# Patient Record
Sex: Male | Born: 1960 | Race: White | Hispanic: No | Marital: Married | Smoking: Never smoker
Health system: Southern US, Community
[De-identification: ages and names within clinical notes are randomized; demographics above are authoritative.]

## PROBLEM LIST (undated history)

## (undated) DIAGNOSIS — K635 Polyp of colon: Secondary | ICD-10-CM

## (undated) DIAGNOSIS — C9140 Hairy cell leukemia not having achieved remission: Secondary | ICD-10-CM

## (undated) HISTORY — DX: Polyp of colon: K63.5

## (undated) HISTORY — PX: TONSILLECTOMY AND ADENOIDECTOMY: SUR1326

## (undated) HISTORY — PX: BILATERAL KNEE ARTHROSCOPY: SUR91

---

## 2009-02-13 ENCOUNTER — Ambulatory Visit: Payer: Self-pay | Admitting: Oncology

## 2009-02-13 ENCOUNTER — Other Ambulatory Visit: Admission: RE | Admit: 2009-02-13 | Discharge: 2009-02-13 | Payer: Self-pay | Admitting: Oncology

## 2009-02-13 LAB — CBC WITH DIFFERENTIAL/PLATELET
BASO%: 0.2 % (ref 0.0–2.0)
Basophils Absolute: 0 10*3/uL (ref 0.0–0.1)
EOS%: 0.6 % (ref 0.0–7.0)
Eosinophils Absolute: 0 10*3/uL (ref 0.0–0.5)
HCT: 49.2 % (ref 38.4–49.9)
HGB: 16.9 g/dL (ref 13.0–17.1)
LYMPH%: 26.4 % (ref 14.0–49.0)
MCH: 30.6 pg (ref 27.2–33.4)
MCHC: 34.3 g/dL (ref 32.0–36.0)
MCV: 89 fL (ref 79.3–98.0)
MONO#: 0.1 10*3/uL (ref 0.1–0.9)
MONO%: 4.2 % (ref 0.0–14.0)
NEUT#: 1.8 10*3/uL (ref 1.5–6.5)
NEUT%: 68.6 % (ref 39.0–75.0)
Platelets: 58 10*3/uL — ABNORMAL LOW (ref 140–400)
RBC: 5.53 10*6/uL (ref 4.20–5.82)
RDW: 14.1 % (ref 11.0–14.6)
WBC: 2.6 10*3/uL — ABNORMAL LOW (ref 4.0–10.3)
lymph#: 0.7 10*3/uL — ABNORMAL LOW (ref 0.9–3.3)

## 2009-02-13 LAB — MORPHOLOGY
PLT EST: DECREASED
RBC Comments: NORMAL

## 2009-02-13 LAB — CHCC SMEAR

## 2009-02-17 LAB — COMPREHENSIVE METABOLIC PANEL
ALT: 21 U/L (ref 0–53)
AST: 16 U/L (ref 0–37)
Albumin: 4.6 g/dL (ref 3.5–5.2)
Alkaline Phosphatase: 60 U/L (ref 39–117)
BUN: 15 mg/dL (ref 6–23)
CO2: 24 mEq/L (ref 19–32)
Calcium: 8.6 mg/dL (ref 8.4–10.5)
Chloride: 106 mEq/L (ref 96–112)
Creatinine, Ser: 1.34 mg/dL (ref 0.40–1.50)
Glucose, Bld: 152 mg/dL — ABNORMAL HIGH (ref 70–99)
Potassium: 4.1 mEq/L (ref 3.5–5.3)
Sodium: 142 mEq/L (ref 135–145)
Total Bilirubin: 1.2 mg/dL (ref 0.3–1.2)
Total Protein: 6.6 g/dL (ref 6.0–8.3)

## 2009-02-17 LAB — IMMUNOFIXATION ELECTROPHORESIS
IgG (Immunoglobin G), Serum: 541 mg/dL — ABNORMAL LOW (ref 694–1618)
Total Protein, Serum Electrophoresis: 6.6 g/dL (ref 6.0–8.3)

## 2009-02-17 LAB — LACTATE DEHYDROGENASE: LDH: 153 U/L (ref 94–250)

## 2009-02-17 LAB — URIC ACID: Uric Acid, Serum: 6.7 mg/dL (ref 4.0–7.8)

## 2009-02-20 ENCOUNTER — Ambulatory Visit: Payer: Self-pay | Admitting: Oncology

## 2009-02-20 ENCOUNTER — Ambulatory Visit (HOSPITAL_COMMUNITY): Admission: RE | Admit: 2009-02-20 | Discharge: 2009-02-20 | Payer: Self-pay | Admitting: Oncology

## 2009-02-20 LAB — FLOW CYTOMETRY

## 2009-02-21 ENCOUNTER — Ambulatory Visit (HOSPITAL_COMMUNITY): Admission: RE | Admit: 2009-02-21 | Discharge: 2009-02-21 | Payer: Self-pay | Admitting: Oncology

## 2009-02-26 LAB — CBC WITH DIFFERENTIAL/PLATELET
Basophils Absolute: 0 10*3/uL (ref 0.0–0.1)
Eosinophils Absolute: 0 10*3/uL (ref 0.0–0.5)
HCT: 48 % (ref 38.4–49.9)
HGB: 16.6 g/dL (ref 13.0–17.1)
MONO#: 0.1 10*3/uL (ref 0.1–0.9)
NEUT%: 57.1 % (ref 39.0–75.0)
WBC: 2.5 10*3/uL — ABNORMAL LOW (ref 4.0–10.3)
lymph#: 0.9 10*3/uL (ref 0.9–3.3)

## 2009-02-26 LAB — CHCC SMEAR

## 2009-02-26 LAB — MORPHOLOGY: PLT EST: DECREASED

## 2009-03-06 LAB — COMPREHENSIVE METABOLIC PANEL WITH GFR
ALT: 28 U/L (ref 0–53)
AST: 19 U/L (ref 0–37)
Albumin: 4.8 g/dL (ref 3.5–5.2)
Alkaline Phosphatase: 58 U/L (ref 39–117)
BUN: 15 mg/dL (ref 6–23)
CO2: 25 meq/L (ref 19–32)
Calcium: 9.8 mg/dL (ref 8.4–10.5)
Chloride: 104 meq/L (ref 96–112)
Creatinine, Ser: 1.22 mg/dL (ref 0.40–1.50)
Glucose, Bld: 97 mg/dL (ref 70–99)
Potassium: 4.1 meq/L (ref 3.5–5.3)
Sodium: 139 meq/L (ref 135–145)
Total Bilirubin: 1.4 mg/dL — ABNORMAL HIGH (ref 0.3–1.2)
Total Protein: 6.9 g/dL (ref 6.0–8.3)

## 2009-03-06 LAB — CBC WITH DIFFERENTIAL/PLATELET
BASO%: 0.2 % (ref 0.0–2.0)
Basophils Absolute: 0 10e3/uL (ref 0.0–0.1)
EOS%: 1 % (ref 0.0–7.0)
Eosinophils Absolute: 0 10e3/uL (ref 0.0–0.5)
HCT: 49.3 % (ref 38.4–49.9)
HGB: 16.9 g/dL (ref 13.0–17.1)
LYMPH%: 37.5 % (ref 14.0–49.0)
MCH: 30.7 pg (ref 27.2–33.4)
MCHC: 34.3 g/dL (ref 32.0–36.0)
MCV: 89.5 fL (ref 79.3–98.0)
MONO#: 0.1 10e3/uL (ref 0.1–0.9)
MONO%: 2.9 % (ref 0.0–14.0)
NEUT#: 1.6 10e3/uL (ref 1.5–6.5)
NEUT%: 58.4 % (ref 39.0–75.0)
Platelets: 57 10e3/uL — ABNORMAL LOW (ref 140–400)
RBC: 5.51 10e6/uL (ref 4.20–5.82)
RDW: 13.9 % (ref 11.0–14.6)
WBC: 2.7 10e3/uL — ABNORMAL LOW (ref 4.0–10.3)
lymph#: 1 10e3/uL (ref 0.9–3.3)

## 2009-03-06 LAB — URIC ACID: Uric Acid, Serum: 6.4 mg/dL (ref 4.0–7.8)

## 2009-03-20 ENCOUNTER — Ambulatory Visit: Payer: Self-pay | Admitting: Oncology

## 2009-03-24 LAB — CBC WITH DIFFERENTIAL/PLATELET
Eosinophils Absolute: 0 10*3/uL (ref 0.0–0.5)
LYMPH%: 39.6 % (ref 14.0–49.0)
MCHC: 35 g/dL (ref 32.0–36.0)
MCV: 86.5 fL (ref 79.3–98.0)
MONO%: 4.9 % (ref 0.0–14.0)
NEUT#: 1.4 10*3/uL — ABNORMAL LOW (ref 1.5–6.5)
NEUT%: 54.3 % (ref 39.0–75.0)
Platelets: 43 10*3/uL — ABNORMAL LOW (ref 140–400)
RBC: 5.41 10*6/uL (ref 4.20–5.82)
nRBC: 0 % (ref 0–0)

## 2009-03-31 LAB — CBC WITH DIFFERENTIAL/PLATELET
BASO%: 0.4 % (ref 0.0–2.0)
EOS%: 0.7 % (ref 0.0–7.0)
HCT: 43.9 % (ref 38.4–49.9)
LYMPH%: 15.4 % (ref 14.0–49.0)
MCH: 30 pg (ref 27.2–33.4)
MCHC: 34.9 g/dL (ref 32.0–36.0)
MCV: 86.1 fL (ref 79.3–98.0)
MONO%: 1.1 % (ref 0.0–14.0)
NEUT%: 82.4 % — ABNORMAL HIGH (ref 39.0–75.0)
lymph#: 0.4 10*3/uL — ABNORMAL LOW (ref 0.9–3.3)

## 2009-04-07 LAB — CBC WITH DIFFERENTIAL/PLATELET
Basophils Absolute: 0 10*3/uL (ref 0.0–0.1)
Eosinophils Absolute: 0 10*3/uL (ref 0.0–0.5)
HGB: 15.8 g/dL (ref 13.0–17.1)
LYMPH%: 13.9 % — ABNORMAL LOW (ref 14.0–49.0)
MONO#: 0 10*3/uL — ABNORMAL LOW (ref 0.1–0.9)
NEUT%: 84.2 % — ABNORMAL HIGH (ref 39.0–75.0)

## 2009-04-14 LAB — COMPREHENSIVE METABOLIC PANEL
ALT: 37 U/L (ref 0–53)
Alkaline Phosphatase: 67 U/L (ref 39–117)
Sodium: 142 mEq/L (ref 135–145)
Total Bilirubin: 0.8 mg/dL (ref 0.3–1.2)
Total Protein: 7 g/dL (ref 6.0–8.3)

## 2009-04-14 LAB — CBC WITH DIFFERENTIAL/PLATELET
EOS%: 0.8 % (ref 0.0–7.0)
MCH: 31.5 pg (ref 27.2–33.4)
MCV: 90.3 fL (ref 79.3–98.0)
MONO%: 0.5 % (ref 0.0–14.0)
NEUT#: 4.2 10*3/uL (ref 1.5–6.5)
RBC: 4.86 10*6/uL (ref 4.20–5.82)
RDW: 14.2 % (ref 11.0–14.6)

## 2009-04-14 LAB — LACTATE DEHYDROGENASE: LDH: 137 U/L (ref 94–250)

## 2009-05-08 ENCOUNTER — Ambulatory Visit: Payer: Self-pay | Admitting: Oncology

## 2009-05-12 LAB — CBC WITH DIFFERENTIAL/PLATELET
BASO%: 0.3 % (ref 0.0–2.0)
HCT: 44.3 % (ref 38.4–49.9)
LYMPH%: 17.9 % (ref 14.0–49.0)
MCH: 30.7 pg (ref 27.2–33.4)
MCHC: 34.8 g/dL (ref 32.0–36.0)
MCV: 88.4 fL (ref 79.3–98.0)
MONO#: 0 10*3/uL — ABNORMAL LOW (ref 0.1–0.9)
MONO%: 1 % (ref 0.0–14.0)
NEUT%: 79.8 % — ABNORMAL HIGH (ref 39.0–75.0)
Platelets: 102 10*3/uL — ABNORMAL LOW (ref 140–400)
WBC: 3 10*3/uL — ABNORMAL LOW (ref 4.0–10.3)

## 2009-05-26 ENCOUNTER — Other Ambulatory Visit: Admission: RE | Admit: 2009-05-26 | Discharge: 2009-05-26 | Payer: Self-pay | Admitting: Oncology

## 2009-05-26 LAB — CBC WITH DIFFERENTIAL/PLATELET
BASO%: 0.3 % (ref 0.0–2.0)
EOS%: 1.1 % (ref 0.0–7.0)
HCT: 43.7 % (ref 38.4–49.9)
LYMPH%: 17.4 % (ref 14.0–49.0)
MCH: 31.2 pg (ref 27.2–33.4)
MCHC: 34.7 g/dL (ref 32.0–36.0)
NEUT%: 79.1 % — ABNORMAL HIGH (ref 39.0–75.0)
lymph#: 0.6 10*3/uL — ABNORMAL LOW (ref 0.9–3.3)

## 2009-06-09 ENCOUNTER — Ambulatory Visit: Payer: Self-pay | Admitting: Oncology

## 2009-06-09 LAB — CBC WITH DIFFERENTIAL/PLATELET
Basophils Absolute: 0 10*3/uL (ref 0.0–0.1)
Eosinophils Absolute: 0 10*3/uL (ref 0.0–0.5)
HCT: 45 % (ref 38.4–49.9)
HGB: 15.9 g/dL (ref 13.0–17.1)
MCV: 86.5 fL (ref 79.3–98.0)
NEUT#: 3.2 10*3/uL (ref 1.5–6.5)
NEUT%: 77 % — ABNORMAL HIGH (ref 39.0–75.0)
RDW: 12.6 % (ref 11.0–14.6)
lymph#: 0.8 10*3/uL — ABNORMAL LOW (ref 0.9–3.3)

## 2009-07-08 LAB — CBC WITH DIFFERENTIAL/PLATELET
Basophils Absolute: 0 10*3/uL (ref 0.0–0.1)
Eosinophils Absolute: 0 10*3/uL (ref 0.0–0.5)
HCT: 46.5 % (ref 38.4–49.9)
HGB: 16.2 g/dL (ref 13.0–17.1)
LYMPH%: 16.5 % (ref 14.0–49.0)
MCV: 89.1 fL (ref 79.3–98.0)
MONO#: 0.1 10*3/uL (ref 0.1–0.9)
NEUT#: 3.2 10*3/uL (ref 1.5–6.5)
NEUT%: 78.9 % — ABNORMAL HIGH (ref 39.0–75.0)
Platelets: 112 10*3/uL — ABNORMAL LOW (ref 140–400)
WBC: 4 10*3/uL (ref 4.0–10.3)

## 2009-07-31 ENCOUNTER — Ambulatory Visit: Payer: Self-pay | Admitting: Oncology

## 2009-08-04 LAB — CBC WITH DIFFERENTIAL/PLATELET
Basophils Absolute: 0 10*3/uL (ref 0.0–0.1)
Eosinophils Absolute: 0 10*3/uL (ref 0.0–0.5)
HCT: 46.9 % (ref 38.4–49.9)
HGB: 16.6 g/dL (ref 13.0–17.1)
MONO#: 0.2 10*3/uL (ref 0.1–0.9)
NEUT#: 3.6 10*3/uL (ref 1.5–6.5)
NEUT%: 80.1 % — ABNORMAL HIGH (ref 39.0–75.0)
RDW: 12.2 % (ref 11.0–14.6)
lymph#: 0.7 10*3/uL — ABNORMAL LOW (ref 0.9–3.3)

## 2009-08-15 ENCOUNTER — Other Ambulatory Visit: Admission: RE | Admit: 2009-08-15 | Discharge: 2009-08-15 | Payer: Self-pay | Admitting: Oncology

## 2009-08-15 LAB — CBC WITH DIFFERENTIAL/PLATELET
Basophils Absolute: 0 10*3/uL (ref 0.0–0.1)
EOS%: 1.1 % (ref 0.0–7.0)
Eosinophils Absolute: 0 10*3/uL (ref 0.0–0.5)
LYMPH%: 14.3 % (ref 14.0–49.0)
MCH: 30.3 pg (ref 27.2–33.4)
MCV: 87.1 fL (ref 79.3–98.0)
MONO%: 4.5 % (ref 0.0–14.0)
Platelets: 119 10*3/uL — ABNORMAL LOW (ref 140–400)
RBC: 5.35 10*6/uL (ref 4.20–5.82)
RDW: 12.4 % (ref 11.0–14.6)

## 2009-08-15 LAB — COMPREHENSIVE METABOLIC PANEL
AST: 26 U/L (ref 0–37)
Albumin: 3.9 g/dL (ref 3.5–5.2)
BUN: 15 mg/dL (ref 6–23)
Calcium: 9 mg/dL (ref 8.4–10.5)
Chloride: 105 mEq/L (ref 96–112)
Creatinine, Ser: 1.63 mg/dL — ABNORMAL HIGH (ref 0.40–1.50)
Glucose, Bld: 106 mg/dL — ABNORMAL HIGH (ref 70–99)
Potassium: 3.9 mEq/L (ref 3.5–5.3)

## 2009-08-15 LAB — URIC ACID: Uric Acid, Serum: 7.6 mg/dL (ref 4.0–7.8)

## 2009-08-19 LAB — FLOW CYTOMETRY

## 2009-10-08 ENCOUNTER — Ambulatory Visit: Payer: Self-pay | Admitting: Oncology

## 2009-10-10 LAB — BASIC METABOLIC PANEL
BUN: 19 mg/dL (ref 6–23)
CO2: 28 mEq/L (ref 19–32)
Chloride: 107 mEq/L (ref 96–112)
Creatinine, Ser: 1.39 mg/dL (ref 0.40–1.50)
Potassium: 3.9 mEq/L (ref 3.5–5.3)

## 2009-10-10 LAB — CBC WITH DIFFERENTIAL/PLATELET
Basophils Absolute: 0 10*3/uL (ref 0.0–0.1)
Eosinophils Absolute: 0 10*3/uL (ref 0.0–0.5)
HCT: 47.3 % (ref 38.4–49.9)
HGB: 16.1 g/dL (ref 13.0–17.1)
MCH: 29.5 pg (ref 27.2–33.4)
MONO#: 0.1 10*3/uL (ref 0.1–0.9)
NEUT#: 3.4 10*3/uL (ref 1.5–6.5)
NEUT%: 79.1 % — ABNORMAL HIGH (ref 39.0–75.0)
RDW: 12.7 % (ref 11.0–14.6)
WBC: 4.3 10*3/uL (ref 4.0–10.3)
lymph#: 0.7 10*3/uL — ABNORMAL LOW (ref 0.9–3.3)

## 2009-12-03 ENCOUNTER — Ambulatory Visit: Payer: Self-pay | Admitting: Oncology

## 2009-12-08 LAB — BASIC METABOLIC PANEL
CO2: 29 mEq/L (ref 19–32)
Calcium: 9.2 mg/dL (ref 8.4–10.5)
Creatinine, Ser: 1.38 mg/dL (ref 0.40–1.50)
Sodium: 142 mEq/L (ref 135–145)

## 2009-12-08 LAB — CBC WITH DIFFERENTIAL/PLATELET
BASO%: 0.2 % (ref 0.0–2.0)
EOS%: 1 % (ref 0.0–7.0)
LYMPH%: 13.1 % — ABNORMAL LOW (ref 14.0–49.0)
MCH: 30.3 pg (ref 27.2–33.4)
MCHC: 35.1 g/dL (ref 32.0–36.0)
MONO#: 0.2 10*3/uL (ref 0.1–0.9)
Platelets: 122 10*3/uL — ABNORMAL LOW (ref 140–400)
RBC: 5.43 10*6/uL (ref 4.20–5.82)
WBC: 4.5 10*3/uL (ref 4.0–10.3)

## 2010-01-19 ENCOUNTER — Ambulatory Visit: Payer: Self-pay | Admitting: Oncology

## 2010-01-21 LAB — BASIC METABOLIC PANEL
BUN: 18 mg/dL (ref 6–23)
Calcium: 9.1 mg/dL (ref 8.4–10.5)
Creatinine, Ser: 1.32 mg/dL (ref 0.40–1.50)
Glucose, Bld: 123 mg/dL — ABNORMAL HIGH (ref 70–99)

## 2010-01-21 LAB — CBC WITH DIFFERENTIAL/PLATELET
Basophils Absolute: 0 10*3/uL (ref 0.0–0.1)
EOS%: 0.9 % (ref 0.0–7.0)
HCT: 46.2 % (ref 38.4–49.9)
HGB: 16.3 g/dL (ref 13.0–17.1)
LYMPH%: 13.5 % — ABNORMAL LOW (ref 14.0–49.0)
MCH: 30.5 pg (ref 27.2–33.4)
MCV: 86.4 fL (ref 79.3–98.0)
MONO%: 3.1 % (ref 0.0–14.0)
NEUT%: 82.2 % — ABNORMAL HIGH (ref 39.0–75.0)
Platelets: 121 10*3/uL — ABNORMAL LOW (ref 140–400)
RDW: 12.6 % (ref 11.0–14.6)

## 2010-02-19 ENCOUNTER — Ambulatory Visit (HOSPITAL_BASED_OUTPATIENT_CLINIC_OR_DEPARTMENT_OTHER): Payer: Self-pay | Admitting: Oncology

## 2010-02-20 LAB — CBC WITH DIFFERENTIAL/PLATELET
BASO%: 0.1 % (ref 0.0–2.0)
Basophils Absolute: 0 10*3/uL (ref 0.0–0.1)
EOS%: 0.8 % (ref 0.0–7.0)
Eosinophils Absolute: 0 10*3/uL (ref 0.0–0.5)
HCT: 48.5 % (ref 38.4–49.9)
HGB: 16.9 g/dL (ref 13.0–17.1)
LYMPH%: 12.8 % — ABNORMAL LOW (ref 14.0–49.0)
MCH: 30 pg (ref 27.2–33.4)
MCHC: 34.8 g/dL (ref 32.0–36.0)
MCV: 86.3 fL (ref 79.3–98.0)
MONO#: 0.1 10*3/uL (ref 0.1–0.9)
MONO%: 1.7 % (ref 0.0–14.0)
NEUT#: 4.1 10*3/uL (ref 1.5–6.5)
NEUT%: 84.6 % — ABNORMAL HIGH (ref 39.0–75.0)
Platelets: 119 10*3/uL — ABNORMAL LOW (ref 140–400)
RBC: 5.62 10*6/uL (ref 4.20–5.82)
RDW: 13 % (ref 11.0–14.6)
WBC: 4.8 10*3/uL (ref 4.0–10.3)
lymph#: 0.6 10*3/uL — ABNORMAL LOW (ref 0.9–3.3)

## 2010-02-20 LAB — COMPREHENSIVE METABOLIC PANEL
ALT: 24 U/L (ref 0–53)
AST: 18 U/L (ref 0–37)
Albumin: 4.9 g/dL (ref 3.5–5.2)
Alkaline Phosphatase: 58 U/L (ref 39–117)
BUN: 20 mg/dL (ref 6–23)
CO2: 28 mEq/L (ref 19–32)
Calcium: 9.8 mg/dL (ref 8.4–10.5)
Chloride: 105 mEq/L (ref 96–112)
Creatinine, Ser: 1.27 mg/dL (ref 0.40–1.50)
Glucose, Bld: 104 mg/dL — ABNORMAL HIGH (ref 70–99)
Potassium: 4.5 mEq/L (ref 3.5–5.3)
Sodium: 140 mEq/L (ref 135–145)
Total Bilirubin: 1.1 mg/dL (ref 0.3–1.2)
Total Protein: 7.1 g/dL (ref 6.0–8.3)

## 2010-02-20 LAB — LACTATE DEHYDROGENASE: LDH: 158 U/L (ref 94–250)

## 2010-04-26 LAB — DIFFERENTIAL
Basophils Absolute: 0 10*3/uL (ref 0.0–0.1)
Eosinophils Absolute: 0 10*3/uL (ref 0.0–0.7)
Lymphocytes Relative: 35 % (ref 12–46)
Monocytes Relative: 3 % (ref 3–12)

## 2010-04-26 LAB — BONE MARROW EXAM: Bone Marrow Exam: 30

## 2010-04-26 LAB — CBC
Platelets: 53 10*3/uL — ABNORMAL LOW (ref 150–400)
RDW: 14.3 % (ref 11.5–15.5)
WBC: 1.6 10*3/uL — ABNORMAL LOW (ref 4.0–10.5)

## 2010-04-26 LAB — CHROMOSOME ANALYSIS, BONE MARROW

## 2010-05-22 DIAGNOSIS — C9140 Hairy cell leukemia not having achieved remission: Secondary | ICD-10-CM

## 2010-06-09 ENCOUNTER — Encounter: Payer: BC Managed Care – PPO | Admitting: Oncology

## 2010-06-09 LAB — COMPREHENSIVE METABOLIC PANEL
ALT: 34 U/L (ref 0–53)
Alkaline Phosphatase: 59 U/L (ref 39–117)
CO2: 27 mEq/L (ref 19–32)
Creatinine, Ser: 1.35 mg/dL (ref 0.40–1.50)
Glucose, Bld: 90 mg/dL (ref 70–99)
Sodium: 140 mEq/L (ref 135–145)
Total Bilirubin: 1.1 mg/dL (ref 0.3–1.2)
Total Protein: 6.3 g/dL (ref 6.0–8.3)

## 2010-06-09 LAB — CBC WITH DIFFERENTIAL/PLATELET
BASO%: 0.1 % (ref 0.0–2.0)
HCT: 46.4 % (ref 38.4–49.9)
LYMPH%: 19.2 % (ref 14.0–49.0)
MCH: 30.1 pg (ref 27.2–33.4)
MCHC: 35 g/dL (ref 32.0–36.0)
MCV: 86 fL (ref 79.3–98.0)
MONO#: 0.1 10*3/uL (ref 0.1–0.9)
MONO%: 3.3 % (ref 0.0–14.0)
NEUT%: 76.4 % — ABNORMAL HIGH (ref 39.0–75.0)
Platelets: 122 10*3/uL — ABNORMAL LOW (ref 140–400)
RBC: 5.4 10*6/uL (ref 4.20–5.82)

## 2010-06-09 LAB — LACTATE DEHYDROGENASE: LDH: 181 U/L (ref 94–250)

## 2010-08-27 ENCOUNTER — Other Ambulatory Visit (HOSPITAL_COMMUNITY): Payer: Self-pay | Admitting: Oncology

## 2010-08-27 ENCOUNTER — Encounter (HOSPITAL_BASED_OUTPATIENT_CLINIC_OR_DEPARTMENT_OTHER): Payer: BC Managed Care – PPO | Admitting: Oncology

## 2010-08-27 DIAGNOSIS — C9140 Hairy cell leukemia not having achieved remission: Secondary | ICD-10-CM

## 2010-08-27 LAB — CBC WITH DIFFERENTIAL/PLATELET
Basophils Absolute: 0 10*3/uL (ref 0.0–0.1)
HCT: 46.8 % (ref 38.4–49.9)
HGB: 16.2 g/dL (ref 13.0–17.1)
LYMPH%: 14.6 % (ref 14.0–49.0)
MCHC: 34.5 g/dL (ref 32.0–36.0)
MONO#: 0.1 10*3/uL (ref 0.1–0.9)
NEUT%: 81.4 % — ABNORMAL HIGH (ref 39.0–75.0)
Platelets: 128 10*3/uL — ABNORMAL LOW (ref 140–400)
WBC: 3.9 10*3/uL — ABNORMAL LOW (ref 4.0–10.3)
lymph#: 0.6 10*3/uL — ABNORMAL LOW (ref 0.9–3.3)

## 2010-08-27 LAB — COMPREHENSIVE METABOLIC PANEL
BUN: 20 mg/dL (ref 6–23)
CO2: 26 mEq/L (ref 19–32)
Calcium: 9.3 mg/dL (ref 8.4–10.5)
Chloride: 104 mEq/L (ref 96–112)
Creatinine, Ser: 1.32 mg/dL (ref 0.50–1.35)
Glucose, Bld: 95 mg/dL (ref 70–99)
Total Bilirubin: 1.2 mg/dL (ref 0.3–1.2)

## 2010-08-27 LAB — LACTATE DEHYDROGENASE: LDH: 172 U/L (ref 94–250)

## 2010-11-03 IMAGING — US US ABDOMEN COMPLETE
1 series · 14 of 25 positions shown · non-contrast
Comparison: None.

CLINICAL DATA: Pancytopenia, evaluate spleen

COMPLETE ABDOMINAL ULTRASOUND

[Series 1: us abdomen complete · 0.30mm/px · 14 of 67 slices shown]
[im 1/67]
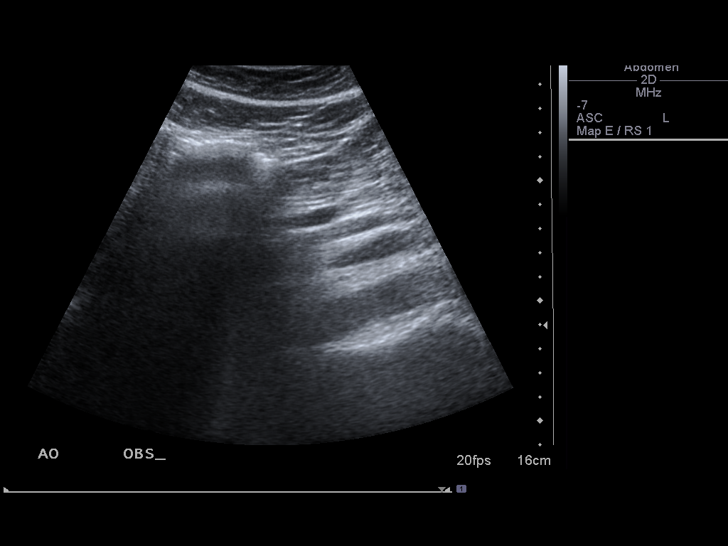
[im 6/67]
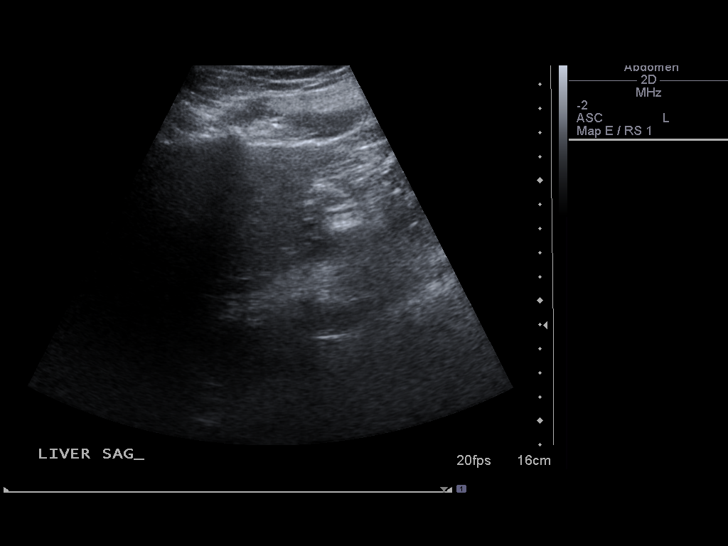
[im 12/67]
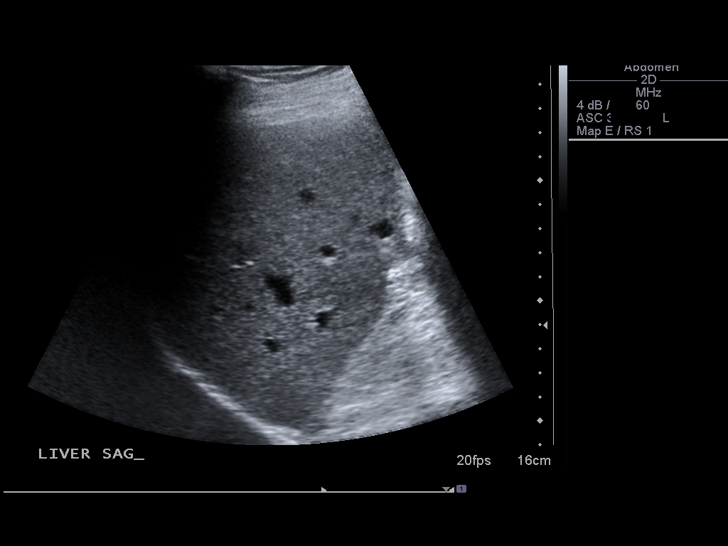
[im 17/67]
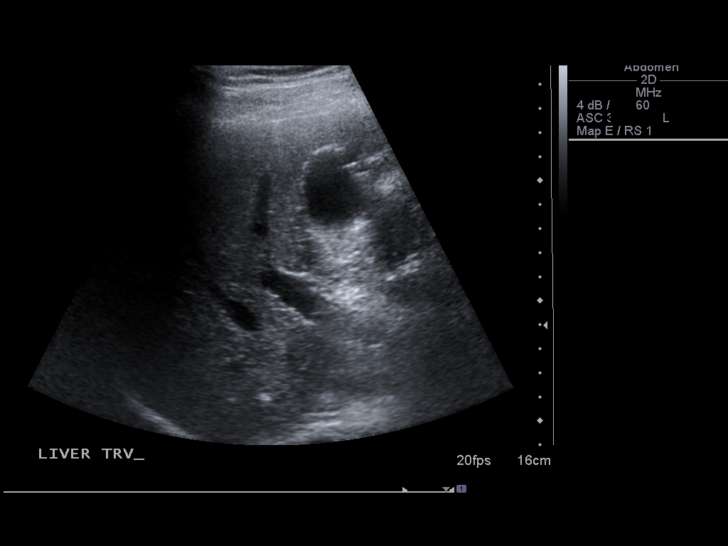
[im 23/67]
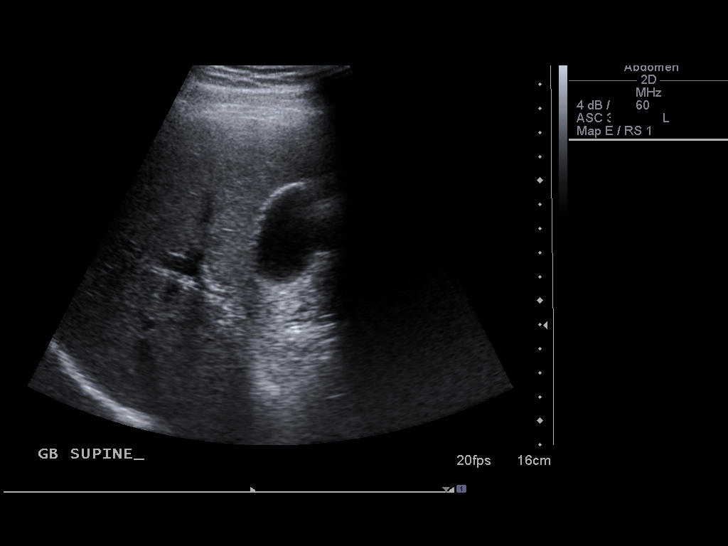
[im 25/67]
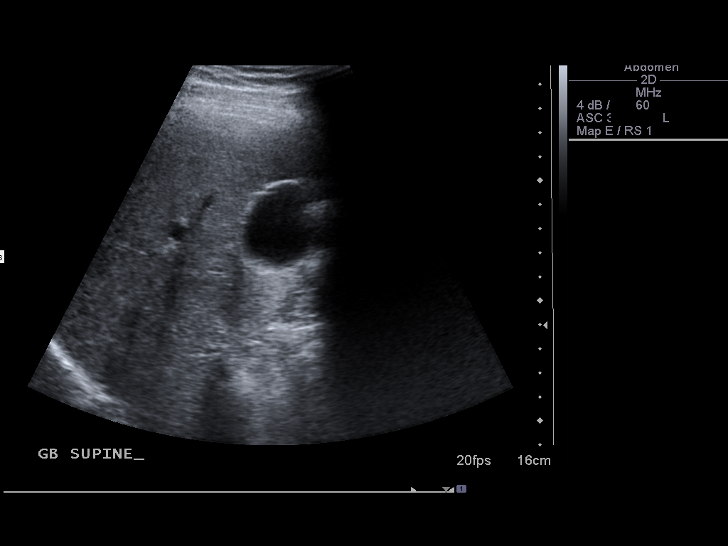
[im 31/67]
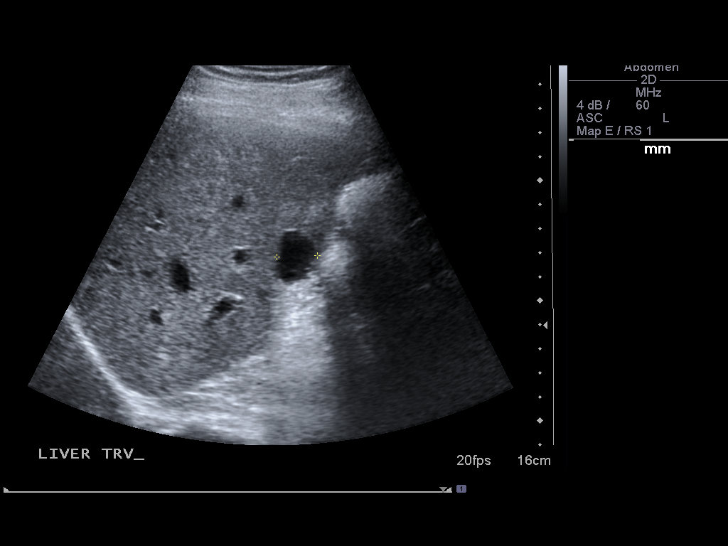
[im 36/67]
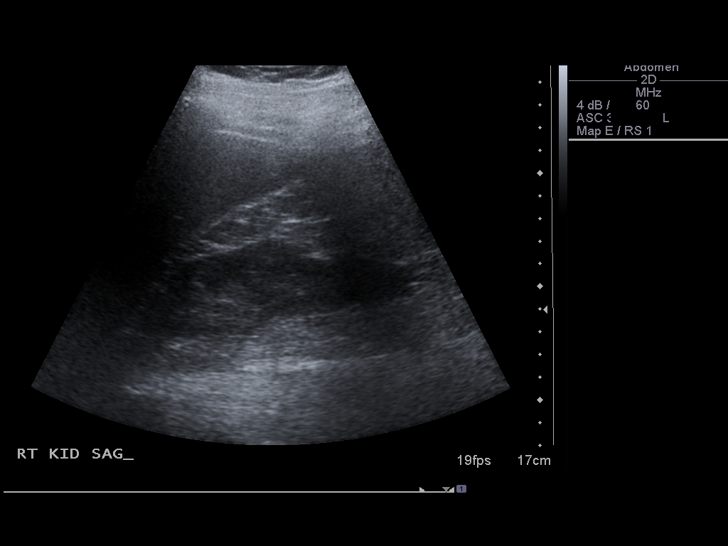
[im 42/67]
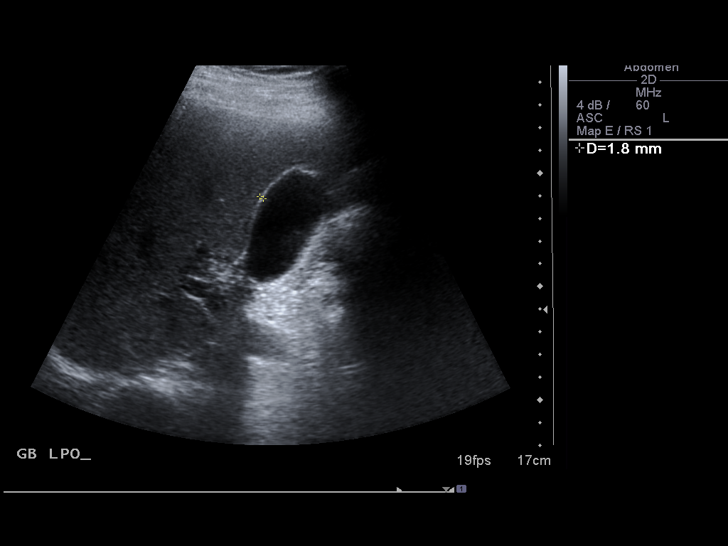
[im 45/67]
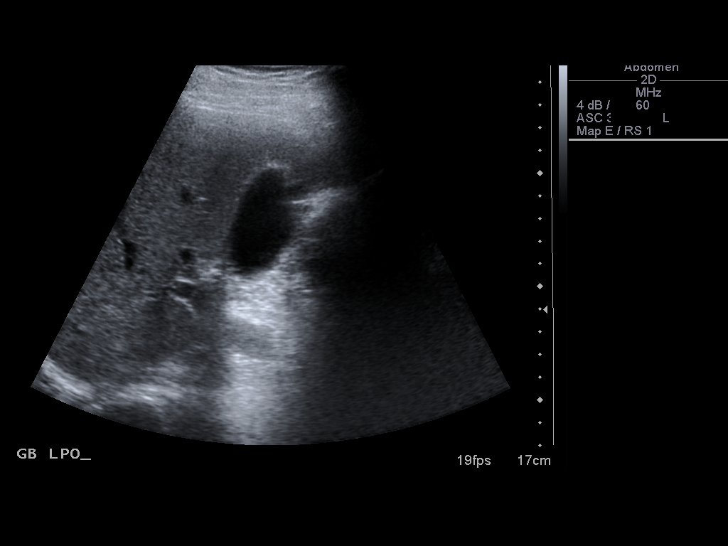
[im 50/67]
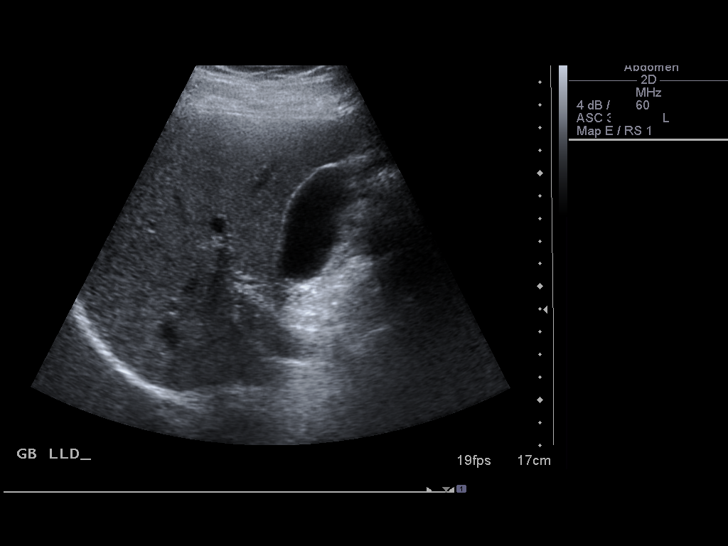
[im 56/67]
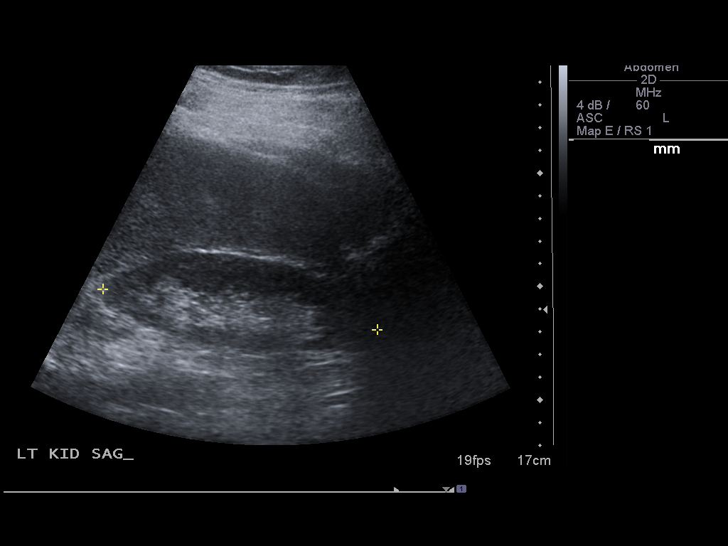
[im 61/67]
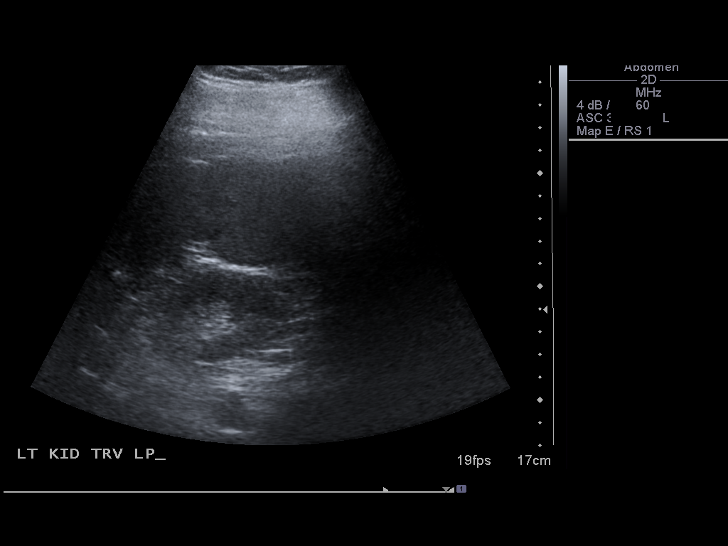
[im 67/67]
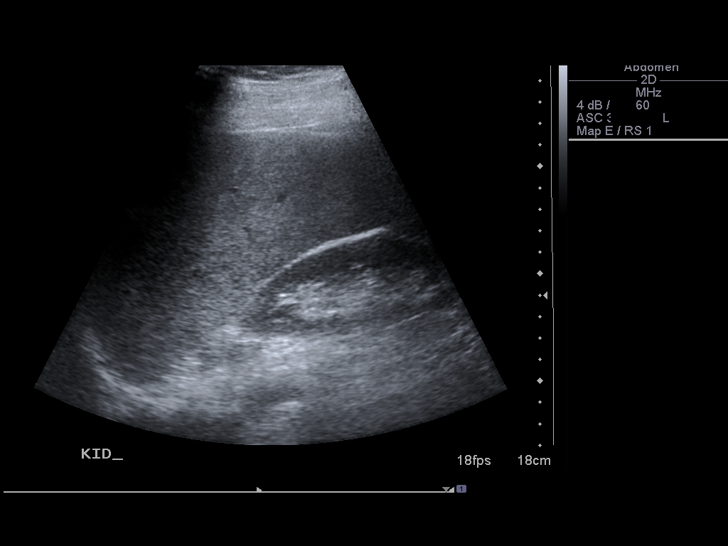

[14 of 25 positions shown; findings below may reference images not displayed]

FINDINGS: Gallbladder:  No gallstones, gallbladder wall thickening, or
pericholecystic fluid.

Common bile duct:  Normal at 3.6 mm

Liver:  Liver is normal in contour and echogenicity.  There is a
simple appearing cyst measuring 2.2 cm in the right hepatic lobe.

IVC:  Not visualized secondary to overlying bowel gas.

Pancreas:  Not visualized secondary to overlying bowel gas.

Spleen:  The spleen is enlarged and heterogeneous in echotexture
measuring 14 cm in craniocaudad dimension with a calculated volume
of 995 ml.

Right Kidney:  12.2cm in length.  No evidence of hydronephrosis or
stones.

Left Kidney:  12.10cm in length.  No evidence of hydronephrosis or
stones.

Abdominal aorta:  No aneurysm identified.
IMPRESSION: 1.  Splenomegaly as described.
2.  Pancreas is not visualized secondary to overlying bowel gas.

## 2010-11-20 ENCOUNTER — Encounter: Payer: Self-pay | Admitting: Nurse Practitioner

## 2010-12-14 ENCOUNTER — Telehealth: Payer: Self-pay | Admitting: Oncology

## 2010-12-14 NOTE — Telephone Encounter (Signed)
S/w the pt regarding his lab appt that he missed in oct. Pt called to r/s lab appt for 12/16/2010

## 2010-12-16 ENCOUNTER — Other Ambulatory Visit (HOSPITAL_BASED_OUTPATIENT_CLINIC_OR_DEPARTMENT_OTHER): Payer: BC Managed Care – PPO | Admitting: Lab

## 2010-12-16 ENCOUNTER — Other Ambulatory Visit (HOSPITAL_COMMUNITY): Payer: Self-pay | Admitting: Oncology

## 2010-12-16 DIAGNOSIS — C9140 Hairy cell leukemia not having achieved remission: Secondary | ICD-10-CM

## 2010-12-16 LAB — CBC WITH DIFFERENTIAL/PLATELET
Basophils Absolute: 0 10*3/uL (ref 0.0–0.1)
EOS%: 1.2 % (ref 0.0–7.0)
Eosinophils Absolute: 0.1 10*3/uL (ref 0.0–0.5)
HCT: 48.8 % (ref 38.4–49.9)
HGB: 16.6 g/dL (ref 13.0–17.1)
MCH: 29.8 pg (ref 27.2–33.4)
MONO#: 0.1 10*3/uL (ref 0.1–0.9)
NEUT#: 4.2 10*3/uL (ref 1.5–6.5)
RDW: 12.7 % (ref 11.0–14.6)
WBC: 5.4 10*3/uL (ref 4.0–10.3)
lymph#: 1 10*3/uL (ref 0.9–3.3)

## 2010-12-16 LAB — COMPREHENSIVE METABOLIC PANEL
AST: 18 U/L (ref 0–37)
Albumin: 4.1 g/dL (ref 3.5–5.2)
BUN: 11 mg/dL (ref 6–23)
CO2: 28 mEq/L (ref 19–32)
Calcium: 9.1 mg/dL (ref 8.4–10.5)
Chloride: 101 mEq/L (ref 96–112)
Potassium: 3.7 mEq/L (ref 3.5–5.3)

## 2010-12-16 LAB — LACTATE DEHYDROGENASE: LDH: 186 U/L (ref 94–250)

## 2011-02-05 ENCOUNTER — Telehealth: Payer: Self-pay | Admitting: Oncology

## 2011-02-05 NOTE — Telephone Encounter (Signed)
lmonvm of the pt;s work phone regarding his feb 2013 appts

## 2011-03-23 ENCOUNTER — Other Ambulatory Visit (HOSPITAL_BASED_OUTPATIENT_CLINIC_OR_DEPARTMENT_OTHER): Payer: BC Managed Care – PPO | Admitting: Lab

## 2011-03-23 ENCOUNTER — Encounter: Payer: Self-pay | Admitting: Oncology

## 2011-03-23 ENCOUNTER — Ambulatory Visit (HOSPITAL_BASED_OUTPATIENT_CLINIC_OR_DEPARTMENT_OTHER): Payer: BC Managed Care – PPO | Admitting: Oncology

## 2011-03-23 VITALS — BP 135/86 | HR 67 | Temp 97.8°F | Ht 72.0 in | Wt 228.1 lb

## 2011-03-23 DIAGNOSIS — C9140 Hairy cell leukemia not having achieved remission: Secondary | ICD-10-CM

## 2011-03-23 DIAGNOSIS — C9141 Hairy cell leukemia, in remission: Secondary | ICD-10-CM | POA: Insufficient documentation

## 2011-03-23 LAB — CBC WITH DIFFERENTIAL/PLATELET
EOS%: 0.8 % (ref 0.0–7.0)
MCH: 30.1 pg (ref 27.2–33.4)
MCV: 86.3 fL (ref 79.3–98.0)
MONO%: 2 % (ref 0.0–14.0)
NEUT#: 4.2 10*3/uL (ref 1.5–6.5)
RBC: 5.65 10*6/uL (ref 4.20–5.82)
RDW: 12.6 % (ref 11.0–14.6)

## 2011-03-23 LAB — COMPREHENSIVE METABOLIC PANEL
ALT: 30 U/L (ref 0–53)
AST: 20 U/L (ref 0–37)
Albumin: 4.5 g/dL (ref 3.5–5.2)
Alkaline Phosphatase: 54 U/L (ref 39–117)
Potassium: 4.1 mEq/L (ref 3.5–5.3)
Sodium: 141 mEq/L (ref 135–145)
Total Protein: 6.4 g/dL (ref 6.0–8.3)

## 2011-03-23 NOTE — Progress Notes (Signed)
This office note has been dictated.  #[id number]

## 2011-03-24 ENCOUNTER — Telehealth: Payer: Self-pay

## 2011-03-24 NOTE — Telephone Encounter (Signed)
lvm at home number and work number requesting PCP so we can fax cmet from 03/23/11

## 2011-03-24 NOTE — Progress Notes (Signed)
CC:   [facility], Fax [phone]  PROBLEM LIST: 1. 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  Bone marrow showed 42% lymphocytes     and approximately 31% hairy cells.  Spleen by ultrasound measured     approximately 14 cm with a volume of 995 mL.  The patient was     treated with 2-chlorodeoxyadenosine for 5 days beginning on     February 14th and concluding on March 28, 2009.  Flow studies on     the peripheral blood in July 2011 were negative.  The patient     remains in remission. 2. Persistent thrombocytopenia.  MEDICATIONS: 1. Fish oil, omega-3 fatty acid 1500 mg daily. 2. Multivitamins 1 daily. 3. Over-the-counter testosterone.  HISTORY:  Hector Lawson is followed for a history of hairy cell leukemia dating back in 2 years, currently in remission.  He was last seen by [place] on 08/27/2010.  He remains clinically well with no problems or complaints.  He works for [employer] as an Art gallery manager.  He feels well and is without complaints.  There have been no intercurrent health problems.  PHYSICAL EXAM:  He looks well.  Vital signs:  Weight is 228 pounds which is his baseline.  Height 6 feet even.  Body surface area 2.29 m squared. Blood pressure 135/86.  Other vital signs are normal.  HEENT:  There is no scleral icterus.  Mouth and pharynx are benign.  No peripheral adenopathy palpable.  Heart and lungs are normal.  Abdomen:  Benign with no organomegaly or masses palpable.  I could not appreciate a palpable spleen.  Hector Lawson has never had a palpable spleen.  Extremities:  No peripheral edema or clubbing.  No petechiae or purpura.  Skin:  Normal. Neurologic:  Exam is normal.  LABORATORY DATA:  Today, white count 5.2, ANC 4.2, hemoglobin 17.0, hematocrit 48.8, platelets 127,000.  Chemistries are pending. Chemistries from 12/16/2010 were  normal.  IMAGING STUDIES:  Ultrasound of the abdomen on 02/21/2009 showed slight enlargement of the spleen measuring 14 cm in craniocaudad dimension with a calculated volume of 995 mL.  IMPRESSION AND PLAN:  Hector Lawson continues to do well.  He was given a copy of his CBC.  He remains with stable slightly low platelet count. Leotis Shames was reassured.  We will check a CBC in 3 months and plan to see him again in 6 months at which time we will check CBC and chemistries.   ______________________________ Samul Dada, M.D. DSM/MEDQ  D:  03/23/2011  T:  03/23/2011  Job:  [id number]

## 2011-06-21 ENCOUNTER — Other Ambulatory Visit (HOSPITAL_BASED_OUTPATIENT_CLINIC_OR_DEPARTMENT_OTHER): Payer: BC Managed Care – PPO

## 2011-06-21 DIAGNOSIS — C9140 Hairy cell leukemia not having achieved remission: Secondary | ICD-10-CM

## 2011-06-21 DIAGNOSIS — C9141 Hairy cell leukemia, in remission: Secondary | ICD-10-CM

## 2011-06-21 LAB — CBC WITH DIFFERENTIAL/PLATELET
BASO%: 0.3 % (ref 0.0–2.0)
Eosinophils Absolute: 0 10*3/uL (ref 0.0–0.5)
LYMPH%: 6.2 % — ABNORMAL LOW (ref 14.0–49.0)
MCHC: 34.9 g/dL (ref 32.0–36.0)
MCV: 86.7 fL (ref 79.3–98.0)
MONO%: 4.3 % (ref 0.0–14.0)
Platelets: 102 10*3/uL — ABNORMAL LOW (ref 140–400)
RBC: 5.36 10*6/uL (ref 4.20–5.82)
nRBC: 0 % (ref 0–0)

## 2011-09-20 ENCOUNTER — Other Ambulatory Visit (HOSPITAL_BASED_OUTPATIENT_CLINIC_OR_DEPARTMENT_OTHER): Payer: BC Managed Care – PPO | Admitting: Lab

## 2011-09-20 ENCOUNTER — Ambulatory Visit (HOSPITAL_BASED_OUTPATIENT_CLINIC_OR_DEPARTMENT_OTHER): Payer: BC Managed Care – PPO | Admitting: Oncology

## 2011-09-20 ENCOUNTER — Encounter: Payer: Self-pay | Admitting: Oncology

## 2011-09-20 VITALS — BP 125/83 | HR 63 | Temp 97.1°F | Resp 20 | Ht 72.0 in | Wt 223.9 lb

## 2011-09-20 DIAGNOSIS — R7989 Other specified abnormal findings of blood chemistry: Secondary | ICD-10-CM | POA: Insufficient documentation

## 2011-09-20 DIAGNOSIS — C9141 Hairy cell leukemia, in remission: Secondary | ICD-10-CM

## 2011-09-20 DIAGNOSIS — C9140 Hairy cell leukemia not having achieved remission: Secondary | ICD-10-CM

## 2011-09-20 DIAGNOSIS — D696 Thrombocytopenia, unspecified: Secondary | ICD-10-CM

## 2011-09-20 LAB — CBC WITH DIFFERENTIAL/PLATELET
BASO%: 0.4 % (ref 0.0–2.0)
EOS%: 1.3 % (ref 0.0–7.0)
HCT: 48.1 % (ref 38.4–49.9)
MCH: 29.9 pg (ref 27.2–33.4)
MCHC: 34.6 g/dL (ref 32.0–36.0)
MONO%: 3.8 % (ref 0.0–14.0)
NEUT%: 75.7 % — ABNORMAL HIGH (ref 39.0–75.0)
lymph#: 0.9 10*3/uL (ref 0.9–3.3)

## 2011-09-20 LAB — COMPREHENSIVE METABOLIC PANEL
ALT: 34 U/L (ref 0–53)
AST: 20 U/L (ref 0–37)
Alkaline Phosphatase: 55 U/L (ref 39–117)
Calcium: 9.6 mg/dL (ref 8.4–10.5)
Chloride: 101 mEq/L (ref 96–112)
Creatinine, Ser: 1.28 mg/dL (ref 0.50–1.35)
Total Bilirubin: 1 mg/dL (ref 0.3–1.2)

## 2011-09-20 NOTE — Progress Notes (Signed)
This office note has been dictated.  #[id number]

## 2011-09-20 NOTE — Progress Notes (Signed)
CC:   [facility]  PROBLEM LIST:  1. 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 Bone marrow showed 42% lymphocytes  and approximately 31% hairy cells. Spleen by ultrasound measured  approximately 14 cm with a volume of 995 mL. The patient was  treated with 2-chlorodeoxyadenosine for 5 days beginning on  February 14th and concluding on March 28, 2009. Flow studies on  the peripheral blood in July 2011 were negative. The patient  remains in remission.  2. Persistent thrombocytopenia.  3. Borderline renal insufficiency.   MEDICATIONS:  1. Fish oil, omega-3 fatty acid 1500 mg daily.  2. Multivitamins 1 daily.  3. Over-the-counter testosterone.    HISTORY:  I saw Hector Lawson for followup of his hairy cell leukemia dating back over 2-1/2 years to January 2011, currently in remission. Hector Lawson was last seen by [place] on 03/23/2011.  It will be recalled that flow studies carried out on 08/18/2009 were negative for evidence of hairy cell leukemia.  The patient feels well.  There have been no health issues over the past 6 months and he feels generally well.  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  His oldest is a daughter who is working in [place].  He has 2 children in high school and 2 younger children.  PHYSICAL EXAM:  He looks well.  Weight is 223.9 pounds, height 6 feet even, body surface area 2.27 m2.  Blood pressure 125/83.  Other vital signs are normal.  There is no scleral icterus.  Mouth and pharynx are benign.  No peripheral adenopathy palpable.  Heart and lungs are normal. Abdomen is benign with no organomegaly or masses palpable.  I could not feel the spleen.  Hector Lawson apparently never had a palpable spleen. Extremities:  No peripheral edema or clubbing.  No petechiae or purpura. Neurologic exam was  normal.  LABORATORY DATA:  Today, white count 4.8, ANC 3.7, hemoglobin 16.7, hematocrit 48.1, platelets 128,000.  Chemistries were essentially normal.  A copy of the patient's lab work was given to him.  IMAGING STUDIES: Ultrasound of the abdomen on 02/21/2009 showed slight  enlargement of the spleen measuring 14 cm in craniocaudad dimension with  a calculated volume of 995 mL.   IMPRESSION AND PLAN:  Hector Lawson continues to do well, now over 2-1/2 years from the time of diagnosis in January 2011.  He remains with mild thrombocytopenia.  He is not having any bruising or bleeding.  He feels well.  We gave him a copy of his lab work.  We will plan to see Hector Lawson again in 6 months, at which time we will check CBC and chemistries.    ______________________________ Samul Dada, M.D. DSM/MEDQ  D:  09/20/2011  T:  09/20/2011  Job:  [id number]

## 2011-09-24 ENCOUNTER — Telehealth: Payer: Self-pay | Admitting: Oncology

## 2011-09-24 NOTE — Telephone Encounter (Signed)
lmonvm advising the pt of his f/u appt in feb 2014

## 2012-03-16 ENCOUNTER — Telehealth: Payer: Self-pay | Admitting: Oncology

## 2012-03-16 NOTE — Telephone Encounter (Signed)
lvm for pt regarding to time change of appt.Marland KitchenMarland KitchenDone..per Marlowe Kays for all of her pt's she need 1hr

## 2012-03-30 ENCOUNTER — Ambulatory Visit: Payer: BC Managed Care – PPO | Admitting: Physician Assistant

## 2012-03-30 ENCOUNTER — Ambulatory Visit (HOSPITAL_BASED_OUTPATIENT_CLINIC_OR_DEPARTMENT_OTHER): Payer: BC Managed Care – PPO | Admitting: Physician Assistant

## 2012-03-30 ENCOUNTER — Other Ambulatory Visit (HOSPITAL_BASED_OUTPATIENT_CLINIC_OR_DEPARTMENT_OTHER): Payer: BC Managed Care – PPO | Admitting: Lab

## 2012-03-30 ENCOUNTER — Other Ambulatory Visit: Payer: Self-pay | Admitting: Oncology

## 2012-03-30 ENCOUNTER — Telehealth: Payer: Self-pay | Admitting: Oncology

## 2012-03-30 ENCOUNTER — Other Ambulatory Visit: Payer: BC Managed Care – PPO | Admitting: Lab

## 2012-03-30 VITALS — BP 136/84 | HR 76 | Temp 98.2°F | Resp 20 | Ht 72.0 in | Wt 229.4 lb

## 2012-03-30 DIAGNOSIS — C9141 Hairy cell leukemia, in remission: Secondary | ICD-10-CM

## 2012-03-30 DIAGNOSIS — D751 Secondary polycythemia: Secondary | ICD-10-CM | POA: Insufficient documentation

## 2012-03-30 DIAGNOSIS — R7989 Other specified abnormal findings of blood chemistry: Secondary | ICD-10-CM

## 2012-03-30 DIAGNOSIS — C9140 Hairy cell leukemia not having achieved remission: Secondary | ICD-10-CM

## 2012-03-30 DIAGNOSIS — D696 Thrombocytopenia, unspecified: Secondary | ICD-10-CM

## 2012-03-30 LAB — COMPREHENSIVE METABOLIC PANEL (CC13)
ALT: 31 U/L (ref 0–55)
Albumin: 3.6 g/dL (ref 3.5–5.0)
CO2: 27 mEq/L (ref 22–29)
Glucose: 131 mg/dl — ABNORMAL HIGH (ref 70–99)
Potassium: 4.2 mEq/L (ref 3.5–5.1)
Sodium: 141 mEq/L (ref 136–145)
Total Protein: 6.3 g/dL — ABNORMAL LOW (ref 6.4–8.3)

## 2012-03-30 LAB — LACTATE DEHYDROGENASE (CC13): LDH: 170 U/L (ref 125–245)

## 2012-03-30 LAB — CBC WITH DIFFERENTIAL/PLATELET
Basophils Absolute: 0 10*3/uL (ref 0.0–0.1)
Eosinophils Absolute: 0.1 10*3/uL (ref 0.0–0.5)
HCT: 47.5 % (ref 38.4–49.9)
HGB: 16.6 g/dL (ref 13.0–17.1)
LYMPH%: 13.4 % — ABNORMAL LOW (ref 14.0–49.0)
MCV: 85.6 fL (ref 79.3–98.0)
MONO%: 3.8 % (ref 0.0–14.0)
NEUT#: 3.4 10*3/uL (ref 1.5–6.5)
Platelets: 105 10*3/uL — ABNORMAL LOW (ref 140–400)

## 2012-03-30 NOTE — Telephone Encounter (Signed)
gv and printed appt schedule for pt for Aug °

## 2012-03-30 NOTE — Patient Instructions (Signed)
Follow up visit in 6 months with labs.

## 2012-03-30 NOTE — Progress Notes (Signed)
Hector Lawson  [phone]   CC: [facility]   PROBLEM LIST:   1. 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 Bone marrow showed 42% lymphocytes and approximately 31% hairy cells. Spleen by ultrasound measured approximately 14 cm with a volume of 995 mL. The patient was treated with 2-chlorodeoxyadenosine for 5 days beginning on February 14th and concluding on March 28, 2009. Flow studies on the peripheral blood in July 2011 were negative. The patient remains in remission.  2. Persistent thrombocytopenia.  3. History of borderline renal insufficiency.      OFFICE  PROGRESS NOTE   Hector Lawson returns  for followup of his hairy cell leukemia dating back over 3 years to January 2011, currently in remission. Hector Lawson was last seen by [place] on 08/12//2013. It will be recalled that flow studies carried out on 08/18/2009 were negative for evidence of hairy cell leukemia. The patient feels well without any new health issues. He is up to date with his annual physicals, due for another one early this year.      MEDICATIONS:Patient is on no meds at this time.     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 His oldest is a daughter who is working in [place]. He has 2 children in high school and 2 younger children.  PHYSICAL EXAMINATION:   Filed Weights   03/30/12 1045  Weight: 229 lb 6.4 oz (104.055 kg)   There is no scleral icterus. Mouth and pharynx are benign. No peripheral adenopathy palpable. Heart and lungs are normal.  Abdomen is benign with no organomegaly or masses palpable. Spleen was not palpable. [name] apparently never had a palpable spleen. Extremities: No peripheral edema or clubbing. No petechiae or purpura. Neurologic exam was  normal.    LABORATORY/RADIOLOGY DATA:   Recent Labs Lab 03/30/12 1000  WBC 4.2  HGB 16.6  HCT 47.5  PLT 105*  MCV 85.6  MCH 29.9  MCHC 34.9  RDW 12.6  LYMPHSABS 0.6*  MONOABS 0.2  EOSABS 0.1  BASOSABS 0.0    CMP    Recent Labs Lab 03/30/12 1000  NA 141  K 4.2  CL 106  CO2 27  GLUCOSE 131*  BUN 10.6  CREATININE 1.4*  CALCIUM 8.8  AST 17  ALT 31  ALKPHOS 56  BILITOT 1.39*        Component Value Date/Time   BILITOT 1.39* 03/30/2012 1000   BILITOT 1.0 09/20/2011 1536   On 09/20/11,  white count 4.8, ANC 3.7, hemoglobin 16.7, hematocrit 48.1, platelets 128,000. Chemistries were essentially normal.B12 was 318 and LDH was 160  IMAGING STUDIES:   Ultrasound of the abdomen on 02/21/2009 showed slight enlargement of the spleen measuring 14 cm in craniocaudad dimension with a calculated volume of 995 mL.  ASSESSMENT AND PLAN:    [name] is now over 3 years from the time of diagnosis in January 2011.His mild  thrombocytopenia is still present. He is not having any bruising or bleeding. In addition, it was noted by Dr. Arline Asp that his H/H has been elevated, for which JAK2 studies have been added to today's blood for further investigation.  He feels well.  We will plan to see Hector Lawson again in 6 months, at which time we will check CBC and chemistries. He knows to call  us in the interim if he has any questions or concerns.      Hector Lawson E 03/30/2012, 11:26 AM

## 2012-05-26 ENCOUNTER — Telehealth: Payer: Self-pay | Admitting: *Deleted

## 2012-05-26 NOTE — Telephone Encounter (Signed)
Lm informing the pt that DSM will be out of the office on 8/19. gv appt d/t for 8/29. Made pt aware that i will mail a letter/cal as a reminder...td

## 2012-09-26 ENCOUNTER — Other Ambulatory Visit: Payer: BC Managed Care – PPO | Admitting: Lab

## 2012-09-26 ENCOUNTER — Ambulatory Visit: Payer: BC Managed Care – PPO | Admitting: Oncology

## 2012-10-02 ENCOUNTER — Telehealth: Payer: Self-pay | Admitting: Hematology and Oncology

## 2012-10-02 NOTE — Telephone Encounter (Signed)
, °

## 2012-10-06 ENCOUNTER — Ambulatory Visit: Payer: Self-pay

## 2012-10-06 ENCOUNTER — Other Ambulatory Visit: Payer: Self-pay | Admitting: Lab

## 2012-10-27 ENCOUNTER — Other Ambulatory Visit (HOSPITAL_BASED_OUTPATIENT_CLINIC_OR_DEPARTMENT_OTHER): Payer: BC Managed Care – PPO | Admitting: Lab

## 2012-10-27 ENCOUNTER — Ambulatory Visit (HOSPITAL_BASED_OUTPATIENT_CLINIC_OR_DEPARTMENT_OTHER): Payer: BC Managed Care – PPO | Admitting: Internal Medicine

## 2012-10-27 ENCOUNTER — Telehealth: Payer: Self-pay | Admitting: *Deleted

## 2012-10-27 VITALS — BP 131/71 | HR 67 | Temp 98.1°F | Resp 20 | Ht 72.0 in | Wt 229.0 lb

## 2012-10-27 DIAGNOSIS — R7989 Other specified abnormal findings of blood chemistry: Secondary | ICD-10-CM

## 2012-10-27 DIAGNOSIS — C9141 Hairy cell leukemia, in remission: Secondary | ICD-10-CM

## 2012-10-27 DIAGNOSIS — C9140 Hairy cell leukemia not having achieved remission: Secondary | ICD-10-CM

## 2012-10-27 DIAGNOSIS — D696 Thrombocytopenia, unspecified: Secondary | ICD-10-CM

## 2012-10-27 LAB — CBC WITH DIFFERENTIAL/PLATELET
BASO%: 0 % (ref 0.0–2.0)
EOS%: 0.1 % (ref 0.0–7.0)
LYMPH%: 6 % — ABNORMAL LOW (ref 14.0–49.0)
MCH: 29.8 pg (ref 27.2–33.4)
MCHC: 35.2 g/dL (ref 32.0–36.0)
MONO#: 0.2 10*3/uL (ref 0.1–0.9)
Platelets: 125 10*3/uL — ABNORMAL LOW (ref 140–400)
RBC: 5.5 10*6/uL (ref 4.20–5.82)
WBC: 7 10*3/uL (ref 4.0–10.3)
lymph#: 0.4 10*3/uL — ABNORMAL LOW (ref 0.9–3.3)
nRBC: 0 % (ref 0–0)

## 2012-10-27 LAB — COMPREHENSIVE METABOLIC PANEL (CC13)
ALT: 32 U/L (ref 0–55)
AST: 13 U/L (ref 5–34)
Alkaline Phosphatase: 58 U/L (ref 40–150)
Calcium: 9.3 mg/dL (ref 8.4–10.4)
Chloride: 106 mEq/L (ref 98–109)
Creatinine: 1.1 mg/dL (ref 0.7–1.3)

## 2012-10-27 NOTE — Telephone Encounter (Signed)
appts made and printed...td 

## 2012-10-27 NOTE — Patient Instructions (Signed)
Colorectal Cancer Screening  Colorectal cancer screening is done to detect early disease. Colorectal refers to the colon and rectum. The colon and rectum are located at the end of the large intestine (digestive system), and carry your bowel movements out of the body. Screening may be done even if you are not experiencing symptoms.   Colorectal cancer screening checks for:  · Polyps. These are small growths in the lining of the colon that can turn cancerous.  · Cancer that is already growing. Cancer is a cluster of abnormal cells that can cause problems in the body.  REASONS FOR COLORECTAL CANCER SCREENING  · It is common for polyps to form in the lining of the colon, especially in older people. These polyps can be cancerous or become cancerous.  · Caught early, colorectal cancer is treatable.  · Cancer can be life threatening. Detecting or preventing cancer early can save your life and allow you to enjoy life longer.  TYPES OF SCREENING  · Fecal occult blood testing. A stool sample is examined for blood in the laboratory.  · Sigmoidoscopy. A sigmoidoscope is used to examine the rectum and lower colon. A sigmoidoscope is a flexible tube with a camera that is inserted through your anus to examine your lower rectum.  · Colonoscopy. The longer colonoscope is used to examine the entire colon. A colonoscope is also a thin, flexible tube with a camera. This test examines the colon and rectum.  Other tests include:  · Digital rectal exam.  · Barium enema.  · Stool DNA test.  · Virtual colonoscopy is the use of computerized X-ray scan (computed tomography, CT) to take X-ray images of your colon.  WHO SHOULD HAVE COLORECTAL CANCER SCREENING?   Screening is recommended for all adults aged 50 to 75 years.   Screening is generally done every 5 to 10 years or more frequently if you have a family history or symptoms.   Screening is rarely recommended in adults aged 76 to 85 years. Screening is not recommended in adults aged 85  years and older.  Your caregiver may recommend screening at a younger age and more frequent screening if you have:  · A history of colorectal cancer or polyps.  · Family members with histories of colorectal cancer or polyps.  · Inflammatory bowel disease, such as ulcerative colitis or Crohn's disease.  · A type of hereditary colon cancer syndrome.  Talk with your caregiver about any symptoms, personal and family history.  SYMPTOMS OF COLORECTAL CANCER  It is important to discuss the following symptoms with your caregiver. These symptoms may be the result of other conditions and may be easily treated:  · Rectal bleeding.  · Blood in your stool.  · Changes in bowel movements (hard or loose stools). These changes may last several weeks.  · Abdominal cramping.  · Feeling the pressure to have a bowel movement when there is no bowel movement.  · Feeling tired or weak.  · Unexplained weight loss.  · Unexplained low red blood cell count. This may also be called iron deficiency anemia.  HOME CARE INSTRUCTIONS   · Follow up with your caregiver as directed.  · Follow all instructions for preparation before your test as well as after.  PREVENTION   Following healthy lifestyle habits each day can reduce your chance of getting colorectal cancer and many other types of cancer:  · Eat a healthy, well-balanced diet rich in fruits and vegetables and low in fats, sugars and cholesterol.  ·   Stay active. Try to exercise at least 4 to 6 times per week for 30 minutes.  · Maintain a healthy weight. Ask your caregiver what a healthy weight range is for you.  · Women should only drink 1 alcoholic drink per day. Men should only drink 2 alcoholic drinks per day.  · Quit smoking.  SEEK MEDICAL CARE IF:   · You experience abdominal or rectal symptoms (see Symptoms of Colorectal Cancer).  · Your gastrointestinal issues (constipation, diarrhea) do not go away as expected.  · You have questions or concerns.  FOR MORE INFORMATION  · American Academy  of Family Physicians www.familydoctor.org  · Centers for Disease Control and Prevention www.cdc.gov  · US Preventive Services Task Force www.uspreventiveservicestaskforce.org  · American Cancer Society www.cancer.org  MAKE SURE YOU:   · Understand these instructions.  · Will watch your condition.  · Will get help right away if you are not doing well or get worse.  Always follow up with your caregiver to find out the results of your tests. Not all test results may be available during your visit. If your test results are not back during the visit, make an appointment with your caregiver to find out the results. Do not assume everything is normal if you have not heard from your caregiver or the medical facility. It is important for you to follow up on all of your test results.   Document Released: 07/15/2009 Document Revised: 04/19/2011 Document Reviewed: 07/15/2009  ExitCare® Patient Information ©2014 [employer].
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
## 2012-12-14 ENCOUNTER — Other Ambulatory Visit: Payer: Self-pay

## 2013-01-19 ENCOUNTER — Telehealth: Payer: Self-pay | Admitting: Oncology

## 2013-01-29 ENCOUNTER — Encounter: Payer: Self-pay | Admitting: Medical Oncology

## 2013-01-29 ENCOUNTER — Telehealth: Payer: Self-pay | Admitting: Medical Oncology

## 2013-01-29 ENCOUNTER — Other Ambulatory Visit (HOSPITAL_BASED_OUTPATIENT_CLINIC_OR_DEPARTMENT_OTHER): Payer: BC Managed Care – PPO

## 2013-01-29 ENCOUNTER — Other Ambulatory Visit: Payer: Self-pay | Admitting: Medical Oncology

## 2013-01-29 DIAGNOSIS — C9141 Hairy cell leukemia, in remission: Secondary | ICD-10-CM

## 2013-01-29 DIAGNOSIS — C9140 Hairy cell leukemia not having achieved remission: Secondary | ICD-10-CM

## 2013-01-29 LAB — CBC WITH DIFFERENTIAL/PLATELET
Eosinophils Absolute: 0.2 10*3/uL (ref 0.0–0.5)
HGB: 16.3 g/dL (ref 13.0–17.1)
MONO#: 0.2 10*3/uL (ref 0.1–0.9)
NEUT#: 4.5 10*3/uL (ref 1.5–6.5)
Platelets: 140 10*3/uL (ref 140–400)
RBC: 5.55 10*6/uL (ref 4.20–5.82)
RDW: 13 % (ref 11.0–14.6)
WBC: 6.2 10*3/uL (ref 4.0–10.3)

## 2013-01-29 NOTE — Telephone Encounter (Signed)
Sherry from Dr. Sherene Sires office called asking about a surgical clearance form for pt. She states that it was faxed on 01/18/13.I checked his office and with nurse who was covering the desk on 01/18/13 and no form.  I explained that Dr. Rosie Fate is out of the office until 02/05/13 and I would have to get the on call MD to review. I found form in Dr. Benjiman Core mailbox and it was received on 01/26/13. I spoke with Dr. Darnelle Catalan and he can not  clear pt until he has labs. Pt came in, CBC drawn and resulted.Dr. Darnelle Catalan reviewed and signed form. Pt is aware of the results and I spoke with Dr. Thurston Hole and gave him the results. Form and CBC results faxed to Dr. Thurston Hole at [phone].

## 2013-04-27 ENCOUNTER — Ambulatory Visit (HOSPITAL_BASED_OUTPATIENT_CLINIC_OR_DEPARTMENT_OTHER): Payer: BC Managed Care – PPO | Admitting: Internal Medicine

## 2013-04-27 ENCOUNTER — Other Ambulatory Visit (HOSPITAL_BASED_OUTPATIENT_CLINIC_OR_DEPARTMENT_OTHER): Payer: BC Managed Care – PPO

## 2013-04-27 ENCOUNTER — Telehealth: Payer: Self-pay | Admitting: Internal Medicine

## 2013-04-27 VITALS — BP 133/80 | HR 78 | Temp 98.0°F | Resp 18 | Ht 72.0 in | Wt 223.8 lb

## 2013-04-27 DIAGNOSIS — D696 Thrombocytopenia, unspecified: Secondary | ICD-10-CM

## 2013-04-27 DIAGNOSIS — C9141 Hairy cell leukemia, in remission: Secondary | ICD-10-CM

## 2013-04-27 DIAGNOSIS — R7989 Other specified abnormal findings of blood chemistry: Secondary | ICD-10-CM

## 2013-04-27 DIAGNOSIS — M549 Dorsalgia, unspecified: Secondary | ICD-10-CM

## 2013-04-27 DIAGNOSIS — C9140 Hairy cell leukemia not having achieved remission: Secondary | ICD-10-CM

## 2013-04-27 LAB — CBC WITH DIFFERENTIAL/PLATELET
BASO%: 0.7 % (ref 0.0–2.0)
Basophils Absolute: 0 10*3/uL (ref 0.0–0.1)
EOS%: 1.4 % (ref 0.0–7.0)
Eosinophils Absolute: 0.1 10*3/uL (ref 0.0–0.5)
HCT: 49.5 % (ref 38.4–49.9)
HEMOGLOBIN: 16.6 g/dL (ref 13.0–17.1)
LYMPH#: 0.7 10*3/uL — AB (ref 0.9–3.3)
LYMPH%: 10.8 % — ABNORMAL LOW (ref 14.0–49.0)
MCH: 29.3 pg (ref 27.2–33.4)
MCHC: 33.6 g/dL (ref 32.0–36.0)
MCV: 87 fL (ref 79.3–98.0)
MONO#: 0.3 10*3/uL (ref 0.1–0.9)
MONO%: 4.6 % (ref 0.0–14.0)
NEUT#: 5.3 10*3/uL (ref 1.5–6.5)
NEUT%: 82.5 % — ABNORMAL HIGH (ref 39.0–75.0)
Platelets: 182 10*3/uL (ref 140–400)
RBC: 5.68 10*6/uL (ref 4.20–5.82)
RDW: 12.5 % (ref 11.0–14.6)
WBC: 6.5 10*3/uL (ref 4.0–10.3)

## 2013-04-27 LAB — COMPREHENSIVE METABOLIC PANEL (CC13)
ALBUMIN: 3.9 g/dL (ref 3.5–5.0)
ALK PHOS: 66 U/L (ref 40–150)
ALT: 26 U/L (ref 0–55)
AST: 15 U/L (ref 5–34)
Anion Gap: 11 mEq/L (ref 3–11)
BUN: 13.3 mg/dL (ref 7.0–26.0)
CALCIUM: 9.5 mg/dL (ref 8.4–10.4)
CHLORIDE: 108 meq/L (ref 98–109)
CO2: 24 mEq/L (ref 22–29)
Creatinine: 1.2 mg/dL (ref 0.7–1.3)
Glucose: 110 mg/dl (ref 70–140)
POTASSIUM: 4.5 meq/L (ref 3.5–5.1)
Sodium: 143 mEq/L (ref 136–145)
Total Bilirubin: 0.97 mg/dL (ref 0.20–1.20)
Total Protein: 6.4 g/dL (ref 6.4–8.3)

## 2013-04-27 NOTE — Telephone Encounter (Signed)
Gave pt appt for lab and MD for September 2015

## 2013-04-27 NOTE — Progress Notes (Signed)
[place] I, MD No address on file  DIAGNOSIS: Hairy cell leukemia, in remission - Plan: CBC with Differential, Comprehensive metabolic panel (Cmet) - [facility], Lactate dehydrogenase (LDH) - [facility]  Abnormal CBC  Thrombocytopenia, unspecified  Chief Complaint  Patient presents with  . Hairy cell leukemia, in remission    CURRENT TREATMENT: None  INTERVAL HISTORY: Hector Lawson 53 y.o. male with a history of hairy cell leukemia, in remission here for six month followup. He was last seen by me on 10/27/2012. He is feeling well over all. He is accompanied by his wife Tish.  About a few weeks ago, he had flu like symptoms but he reports he is near baseline.  His influenzae was negative. He denies fevers.  He denied sick contacts.  He saw his PCP a few weeks ago with minor changes in his medications.  He reports continued low back discomfort.  He finished PT for back in November.  He will schedule his colonoscopy this year.   He denies bleeding or weight lost or fevers or chills. 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 His oldest is a daughter who is working in [place]. He has 2 children in high school and 2 younger children.   MEDICAL HISTORY:No past medical history on file.  INTERIM HISTORY: has Hairy cell leukemia, in remission; Abnormal CBC; Erythrocytosis; and Thrombocytopenia, unspecified on his problem list.    ALLERGIES:  has No Known Allergies.  MEDICATIONS: has a current medication list which includes the following prescription(s): cyanocobalamin, famotidine, magnesium oxide, terbinafine, and vitamin d (ergocalciferol).  SURGICAL HISTORY: No past surgical history on file.  PROBLEM LIST: Hairy cell leukemia diagnosed by a bone marrow on 02/20/2009 when the patient presented with leukopenia and thrombocytopenia as well as a slightly enlarged spleen. Bone marrow showed 42% lymphocytes and approximately 31%  hairy cells. Spleen by ultrasound measured approximately 14 cm with a volume of 995 mL. The patient was treated with 2-chlorodeoxyadenosine for 5 days beginning on February 14th and concluding on March 28, 2009. Flow studies on the peripheral blood in July 2011 were negative. The patient remains in remission.  2. Persistent thrombocytopenia.  3. Borderline renal insufficiency.  REVIEW OF SYSTEMS:   Constitutional: Denies fevers, chills or abnormal weight loss Eyes: Denies blurriness of vision Ears, nose, mouth, throat, and face: Denies mucositis or sore throat Respiratory: Denies cough, dyspnea or wheezes Cardiovascular: Denies palpitation, chest discomfort or lower extremity swelling Gastrointestinal:  Denies nausea, heartburn or change in bowel habits Skin: Denies abnormal skin rashes Lymphatics: Denies new lymphadenopathy or easy bruising Neurological:Denies numbness, tingling or new weaknesses Behavioral/Psych: Mood is stable, no new changes  All other systems were reviewed with the patient and are negative.  PHYSICAL EXAMINATION: ECOG PERFORMANCE STATUS: 0 - Asymptomatic  Blood pressure 133/80, pulse 78, temperature 98 F (36.7 C), temperature source Oral, resp. rate 18, height 6' (1.829 m), weight 223 lb 12.8 oz (101.515 kg).  GENERAL:alert, no distress and comfortable SKIN: skin color, texture, turgor are normal, no rashes or significant lesions EYES: normal, Conjunctiva are pink and non-injected, sclera clear OROPHARYNX:no exudate, no erythema and lips, buccal mucosa, and tongue normal  NECK: supple, thyroid normal size, non-tender, without nodularity LYMPH:  no palpable lymphadenopathy in the cervical, axillary or supraclavicular LUNGS: clear to auscultation with normal breathing effort, no wheezes or rhonchi HEART: regular rate & rhythm and no murmurs and no lower extremity edema ABDOMEN:abdomen soft, non-tender  and normal bowel sounds Musculoskeletal:no cyanosis of  digits and no clubbing  NEURO: alert & oriented x 3 with fluent speech, no focal motor/sensory deficits  Labs:  Lab Results  Component Value Date   WBC 6.5 04/27/2013   HGB 16.6 04/27/2013   HCT 49.5 04/27/2013   MCV 87.0 04/27/2013   PLT 182 04/27/2013   NEUTROABS 5.3 04/27/2013      Chemistry      Component Value Date/Time   NA 141 10/27/2012 0832   NA 138 09/20/2011 1536   K 4.5 10/27/2012 0832   K 4.0 09/20/2011 1536   CL 106 03/30/2012 1000   CL 101 09/20/2011 1536   CO2 26 10/27/2012 0832   CO2 28 09/20/2011 1536   BUN 16.8 10/27/2012 0832   BUN 18 09/20/2011 1536   CREATININE 1.1 10/27/2012 0832   CREATININE 1.28 09/20/2011 1536      Component Value Date/Time   CALCIUM 9.3 10/27/2012 0832   CALCIUM 9.6 09/20/2011 1536   ALKPHOS 58 10/27/2012 0832   ALKPHOS 55 09/20/2011 1536   AST 13 10/27/2012 0832   AST 20 09/20/2011 1536   ALT 32 10/27/2012 0832   ALT 34 09/20/2011 1536   BILITOT 1.09 10/27/2012 0832   BILITOT 1.0 09/20/2011 1536       Basic Metabolic Panel: No results found for this basename: NA, K, CL, CO2, GLUCOSE, BUN, CREATININE, CALCIUM, MG, PHOS,  in the last 168 hours GFR Estimated Creatinine Clearance: 98 ml/min (by C-G formula based on Cr of 1.1). Liver Function Tests: No results found for this basename: AST, ALT, ALKPHOS, BILITOT, PROT, ALBUMIN,  in the last 168 hours No results found for this basename: LIPASE, AMYLASE,  in the last 168 hours No results found for this basename: AMMONIA,  in the last 168 hours Coagulation profile No results found for this basename: INR, PROTIME,  in the last 168 hours  CBC:  Recent Labs Lab 04/27/13 0807  WBC 6.5  NEUTROABS 5.3  HGB 16.6  HCT 49.5  MCV 87.0  PLT 182    Anemia work up No results found for this basename: VITAMINB12, FOLATE, FERRITIN, TIBC, IRON, RETICCTPCT,  in the last 72 hours  Studies:  No results found.   RADIOGRAPHIC STUDIES: No results found.  ASSESSMENT: [name] 53 y.o. male with a  history of Hairy cell leukemia, in remission - Plan: CBC with Differential, Comprehensive metabolic panel (Cmet) - [facility], Lactate dehydrogenase (LDH) - [facility]  Abnormal CBC  Thrombocytopenia, unspecified   PLAN:   1. Hairy cell leukemia, in remission. --Mr. Headen is now over 3.5 years from the time of diagnosis in January of 2011. His throbocytopenia has resolved this visit.  He denies bruising or bleeding. He feels well overall.   2. Low back discomfort.  --Continue physical therapy and strengthening exercises.   3. Follow-up. --We will see Orlo again in 6 months with CBC and chemistries on the day of visit. He will schedule his colonoscopy.   All questions were answered. The patient knows to call the clinic with any problems, questions or concerns. We can certainly see the patient much sooner if necessary.  I spent 10 minutes counseling the patient face to face. The total time spent in the appointment was 15 minutes.    Omir Cooprider, MD 04/27/2013 8:49 AM

## 2013-10-29 ENCOUNTER — Ambulatory Visit: Payer: BC Managed Care – PPO

## 2013-10-29 ENCOUNTER — Other Ambulatory Visit: Payer: BC Managed Care – PPO

## 2013-11-10 ENCOUNTER — Telehealth: Payer: Self-pay | Admitting: Hematology

## 2013-11-10 NOTE — Telephone Encounter (Signed)
returned pt call and advised him call back to r/s missed appt

## 2013-12-24 ENCOUNTER — Telehealth: Payer: Self-pay | Admitting: Hematology

## 2013-12-24 NOTE — Telephone Encounter (Signed)
Pt called and r/s No show appt from 09/12. Confirm appt d/t for 01/02/14.

## 2014-01-01 ENCOUNTER — Other Ambulatory Visit: Payer: Self-pay | Admitting: *Deleted

## 2014-01-01 DIAGNOSIS — D751 Secondary polycythemia: Secondary | ICD-10-CM

## 2014-01-01 DIAGNOSIS — C9141 Hairy cell leukemia, in remission: Secondary | ICD-10-CM

## 2014-01-01 DIAGNOSIS — R7989 Other specified abnormal findings of blood chemistry: Secondary | ICD-10-CM

## 2014-01-02 ENCOUNTER — Ambulatory Visit (HOSPITAL_BASED_OUTPATIENT_CLINIC_OR_DEPARTMENT_OTHER): Payer: BC Managed Care – PPO | Admitting: Hematology

## 2014-01-02 ENCOUNTER — Other Ambulatory Visit (HOSPITAL_BASED_OUTPATIENT_CLINIC_OR_DEPARTMENT_OTHER): Payer: BC Managed Care – PPO

## 2014-01-02 ENCOUNTER — Encounter: Payer: Self-pay | Admitting: Hematology

## 2014-01-02 ENCOUNTER — Telehealth: Payer: Self-pay | Admitting: Hematology

## 2014-01-02 VITALS — BP 127/67 | HR 54 | Temp 98.0°F | Resp 19 | Ht 72.0 in | Wt 224.9 lb

## 2014-01-02 DIAGNOSIS — C9141 Hairy cell leukemia, in remission: Secondary | ICD-10-CM

## 2014-01-02 DIAGNOSIS — R7989 Other specified abnormal findings of blood chemistry: Secondary | ICD-10-CM

## 2014-01-02 DIAGNOSIS — K635 Polyp of colon: Secondary | ICD-10-CM

## 2014-01-02 DIAGNOSIS — D751 Secondary polycythemia: Secondary | ICD-10-CM

## 2014-01-02 DIAGNOSIS — C9140 Hairy cell leukemia not having achieved remission: Secondary | ICD-10-CM

## 2014-01-02 DIAGNOSIS — N289 Disorder of kidney and ureter, unspecified: Secondary | ICD-10-CM

## 2014-01-02 LAB — COMPREHENSIVE METABOLIC PANEL (CC13)
ALBUMIN: 4 g/dL (ref 3.5–5.0)
ALT: 22 U/L (ref 0–55)
ANION GAP: 7 meq/L (ref 3–11)
AST: 15 U/L (ref 5–34)
Alkaline Phosphatase: 59 U/L (ref 40–150)
BUN: 17.8 mg/dL (ref 7.0–26.0)
CO2: 26 meq/L (ref 22–29)
Calcium: 9.1 mg/dL (ref 8.4–10.4)
Chloride: 107 mEq/L (ref 98–109)
Creatinine: 1.4 mg/dL — ABNORMAL HIGH (ref 0.7–1.3)
Glucose: 114 mg/dl (ref 70–140)
POTASSIUM: 4.6 meq/L (ref 3.5–5.1)
SODIUM: 140 meq/L (ref 136–145)
TOTAL PROTEIN: 6.2 g/dL — AB (ref 6.4–8.3)
Total Bilirubin: 1.22 mg/dL — ABNORMAL HIGH (ref 0.20–1.20)

## 2014-01-02 LAB — CBC WITH DIFFERENTIAL/PLATELET
BASO%: 1 % (ref 0.0–2.0)
BASOS ABS: 0 10*3/uL (ref 0.0–0.1)
EOS%: 1.4 % (ref 0.0–7.0)
Eosinophils Absolute: 0 10*3/uL (ref 0.0–0.5)
HEMATOCRIT: 49 % (ref 38.4–49.9)
HEMOGLOBIN: 16.1 g/dL (ref 13.0–17.1)
LYMPH#: 0.8 10*3/uL — AB (ref 0.9–3.3)
LYMPH%: 22.1 % (ref 14.0–49.0)
MCH: 28.6 pg (ref 27.2–33.4)
MCHC: 32.9 g/dL (ref 32.0–36.0)
MCV: 86.8 fL (ref 79.3–98.0)
MONO#: 0.2 10*3/uL (ref 0.1–0.9)
MONO%: 4.9 % (ref 0.0–14.0)
NEUT#: 2.5 10*3/uL (ref 1.5–6.5)
NEUT%: 70.6 % (ref 39.0–75.0)
PLATELETS: 133 10*3/uL — AB (ref 140–400)
RBC: 5.64 10*6/uL (ref 4.20–5.82)
RDW: 12.4 % (ref 11.0–14.6)
WBC: 3.5 10*3/uL — ABNORMAL LOW (ref 4.0–10.3)

## 2014-01-02 LAB — LACTATE DEHYDROGENASE (CC13): LDH: 161 U/L (ref 125–245)

## 2014-01-02 NOTE — Progress Notes (Signed)
[facility] OFFICE PROGRESS NOTE Date of Visit: 01/02/2014  Lauraine Rinne, MD [place] [address]  DIAGNOSIS: Hairy cell leukemia - Plan: CBC with Differential, Comprehensive metabolic panel (Cmet) - [facility], Lactate dehydrogenase (LDH) - [facility]  Colon polyps  Chief Complaint  Patient presents with  . Follow-up    CURRENT TREATMENT: None  INTERVAL HISTORY:  JERMAINE NEUHARTH 53 y.o. male with a history of hairy cell leukemia, in remission here for six month followup. He was last seen by Dr Juliann Mule on 04/27/2013. He is feeling well over all. He is accompanied by his wife Tish.  He denies fevers.  He denied sick contacts.  He got his colonoscopy in July 2015 with Dr Benson [place] and apparently 7 benign polyps were removed and a follow up colonoscopy recommended in 3 years. There is no personal or family history of polyposis.   He denies bleeding or weight lost or fevers or chills. 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 His oldest is a daughter who is working in [place]. He has 2 children in high school and 2 younger children.   MEDICAL HISTORY: Past Medical History  Diagnosis Date  . Colon polyps     INTERIM HISTORY: has Hairy cell leukemia, in remission; Abnormal CBC; Erythrocytosis; and Thrombocytopenia, unspecified on his problem list.    ALLERGIES:  has No Known Allergies.  MEDICATIONS: has a current medication list which includes the following prescription(s): famotidine.  SURGICAL HISTORY: History reviewed. No pertinent past surgical history.  PROBLEM LIST: Hairy cell leukemia diagnosed by a bone marrow on 02/20/2009 when the patient presented with leukopenia and thrombocytopenia as well as a slightly enlarged spleen. Bone marrow showed 42% lymphocytes and approximately 31% hairy cells. Spleen by ultrasound measured approximately 14 cm with a volume of 995 mL. The patient was treated with 2-chlorodeoxyadenosine for 5 days beginning  on February 14th and concluding on March 28, 2009. Flow studies on the peripheral blood in July 2011 were negative. The patient remains in remission.  2. Persistent thrombocytopenia.  3. Borderline renal insufficiency.  REVIEW OF SYSTEMS:   Constitutional: Denies fevers, chills or abnormal weight loss Eyes: Denies blurriness of vision Ears, nose, mouth, throat, and face: Denies mucositis or sore throat Respiratory: Denies cough, dyspnea or wheezes Cardiovascular: Denies palpitation, chest discomfort or lower extremity swelling Gastrointestinal:  Denies nausea, heartburn or change in bowel habits Skin: Denies abnormal skin rashes Lymphatics: Denies new lymphadenopathy or easy bruising Neurological:Denies numbness, tingling or new weaknesses Behavioral/Psych: Mood is stable, no new changes  All other systems were reviewed with the patient and are negative.  PHYSICAL EXAMINATION: ECOG PERFORMANCE STATUS: 0  Blood pressure 127/67, pulse 54, temperature 98 F (36.7 C), temperature source Oral, resp. rate 19, height 6' (1.829 m), weight 224 lb 14.4 oz (102.014 kg), SpO2 99 %.  GENERAL:alert, no distress and comfortable SKIN: skin color, texture, turgor are normal, no rashes or significant lesions EYES: normal, Conjunctiva are pink and non-injected, sclera clear OROPHARYNX:no exudate, no erythema and lips, buccal mucosa, and tongue normal  NECK: supple, thyroid normal size, non-tender, without nodularity LYMPH:  no palpable lymphadenopathy in the cervical, axillary or supraclavicular LUNGS: clear to auscultation with normal breathing effort, no wheezes or rhonchi HEART: regular rate & rhythm and no murmurs and no lower extremity edema ABDOMEN:abdomen soft, non-tender and normal bowel sounds, no hepatosplenomegaly Musculoskeletal:no cyanosis of digits and no clubbing  NEURO: alert & oriented x 3 with fluent  speech, no focal motor/sensory deficits  Labs:  Lab Results  Component Value  Date   WBC 3.5* 01/02/2014   HGB 16.1 01/02/2014   HCT 49.0 01/02/2014   MCV 86.8 01/02/2014   PLT 133* 01/02/2014   NEUTROABS 2.5 01/02/2014      Chemistry      Component Value Date/Time   NA 140 01/02/2014 0949   NA 138 09/20/2011 1536   K 4.6 01/02/2014 0949   K 4.0 09/20/2011 1536   CL 106 03/30/2012 1000   CL 101 09/20/2011 1536   CO2 26 01/02/2014 0949   CO2 28 09/20/2011 1536   BUN 17.8 01/02/2014 0949   BUN 18 09/20/2011 1536   CREATININE 1.4* 01/02/2014 0949   CREATININE 1.28 09/20/2011 1536      Component Value Date/Time   CALCIUM 9.1 01/02/2014 0949   CALCIUM 9.6 09/20/2011 1536   ALKPHOS 59 01/02/2014 0949   ALKPHOS 55 09/20/2011 1536   AST 15 01/02/2014 0949   AST 20 09/20/2011 1536   ALT 22 01/02/2014 0949   ALT 34 09/20/2011 1536   BILITOT 1.22* 01/02/2014 0949   BILITOT 1.0 09/20/2011 1536       Basic Metabolic Panel:  Recent Labs Lab 01/02/14 0949  NA 140  K 4.6  CO2 26  GLUCOSE 114  BUN 17.8  CREATININE 1.4*  CALCIUM 9.1   GFR Estimated Creatinine Clearance: 77.2 mL/min (by C-G formula based on Cr of 1.4). Liver Function Tests:  Recent Labs Lab 01/02/14 0949  AST 15  ALT 22  ALKPHOS 59  BILITOT 1.22*  PROT 6.2*  ALBUMIN 4.0   No results for input(s): LIPASE, AMYLASE in the last 168 hours. No results for input(s): AMMONIA in the last 168 hours. Coagulation profile No results for input(s): INR, PROTIME in the last 168 hours.  CBC:  Recent Labs Lab 01/02/14 0948  WBC 3.5*  NEUTROABS 2.5  HGB 16.1  HCT 49.0  MCV 86.8  PLT 133*    Anemia work up No results for input(s): VITAMINB12, FOLATE, FERRITIN, TIBC, IRON, RETICCTPCT in the last 72 hours.  Studies:  No results found.   RADIOGRAPHIC STUDIES: No results found.  ASSESSMENT: KAYLEE WOMBLES 53 y.o. male with a history of Hairy cell leukemia - Plan: CBC with Differential, Comprehensive metabolic panel (Cmet) - [facility], Lactate dehydrogenase (LDH) - [facility]  Colon  polyps   PLAN:   1. Hairy cell leukemia, in remission. --Mr. Slane is now approaching 5 years from the time of diagnosis in January of 2011. His thrombocytopenia is mild.  He denies bruising or bleeding. He feels well overall. Mild leukopenia but no neutropenia in absence of any symptoms.Flu vaccination offered but patient never get these shots and refused.  2. Mild Renal Insufficiency. --Drink more fluids.  3. Colon Polyps. --7 benign polyps removed by Dr Benson [place] July 2015. Next colonoscopy in 3 years.  4. Follow-up. --Dr Alvy Bimler will see Hussam again in 6 months with CBC and chemistries on the day of visit.    All questions were answered. The patient knows to call the clinic with any problems, questions or concerns. We can certainly see the patient much sooner if necessary.  I spent 10 minutes counseling the patient face to face. The total time spent in the appointment was 15 minutes.  Bernadene Bell, MD Medical Hematologist/Oncologist [place] Pager: [phone] Office No: [phone]

## 2014-01-02 NOTE — Telephone Encounter (Signed)
gv adn printed appt sched and avs for pt for May 2016 °

## 2014-06-21 ENCOUNTER — Other Ambulatory Visit: Payer: Self-pay | Admitting: Hematology and Oncology

## 2014-06-21 ENCOUNTER — Encounter: Payer: Self-pay | Admitting: Hematology and Oncology

## 2014-06-21 ENCOUNTER — Other Ambulatory Visit (HOSPITAL_BASED_OUTPATIENT_CLINIC_OR_DEPARTMENT_OTHER): Payer: BLUE CROSS/BLUE SHIELD

## 2014-06-21 ENCOUNTER — Ambulatory Visit (HOSPITAL_BASED_OUTPATIENT_CLINIC_OR_DEPARTMENT_OTHER): Payer: BLUE CROSS/BLUE SHIELD | Admitting: Hematology and Oncology

## 2014-06-21 ENCOUNTER — Telehealth: Payer: Self-pay | Admitting: Hematology and Oncology

## 2014-06-21 VITALS — BP 148/74 | HR 92 | Temp 98.2°F | Resp 16 | Ht 72.0 in | Wt 226.6 lb

## 2014-06-21 DIAGNOSIS — C9141 Hairy cell leukemia, in remission: Secondary | ICD-10-CM

## 2014-06-21 DIAGNOSIS — J069 Acute upper respiratory infection, unspecified: Secondary | ICD-10-CM

## 2014-06-21 DIAGNOSIS — D696 Thrombocytopenia, unspecified: Secondary | ICD-10-CM | POA: Diagnosis not present

## 2014-06-21 DIAGNOSIS — R17 Unspecified jaundice: Secondary | ICD-10-CM | POA: Diagnosis not present

## 2014-06-21 LAB — CBC WITH DIFFERENTIAL/PLATELET
BASO%: 0.2 % (ref 0.0–2.0)
Basophils Absolute: 0 10*3/uL (ref 0.0–0.1)
EOS%: 0.5 % (ref 0.0–7.0)
Eosinophils Absolute: 0 10*3/uL (ref 0.0–0.5)
HCT: 48.6 % (ref 38.4–49.9)
HGB: 17 g/dL (ref 13.0–17.1)
LYMPH%: 5.9 % — ABNORMAL LOW (ref 14.0–49.0)
MCH: 30.1 pg (ref 27.2–33.4)
MCHC: 35 g/dL (ref 32.0–36.0)
MCV: 86 fL (ref 79.3–98.0)
MONO#: 0.2 10*3/uL (ref 0.1–0.9)
MONO%: 3 % (ref 0.0–14.0)
NEUT%: 90.4 % — ABNORMAL HIGH (ref 39.0–75.0)
NEUTROS ABS: 5.4 10*3/uL (ref 1.5–6.5)
PLATELETS: 98 10*3/uL — AB (ref 140–400)
RBC: 5.65 10*6/uL (ref 4.20–5.82)
RDW: 12.4 % (ref 11.0–14.6)
WBC: 5.9 10*3/uL (ref 4.0–10.3)
lymph#: 0.4 10*3/uL — ABNORMAL LOW (ref 0.9–3.3)

## 2014-06-21 LAB — COMPREHENSIVE METABOLIC PANEL (CC13)
ALK PHOS: 65 U/L (ref 40–150)
ALT: 24 U/L (ref 0–55)
AST: 15 U/L (ref 5–34)
Albumin: 4.1 g/dL (ref 3.5–5.0)
Anion Gap: 10 mEq/L (ref 3–11)
BUN: 14.2 mg/dL (ref 7.0–26.0)
CO2: 25 mEq/L (ref 22–29)
CREATININE: 1.2 mg/dL (ref 0.7–1.3)
Calcium: 8.9 mg/dL (ref 8.4–10.4)
Chloride: 106 mEq/L (ref 98–109)
EGFR: 66 mL/min/{1.73_m2} — AB (ref >90)
Glucose: 117 mg/dl (ref 70–140)
POTASSIUM: 4.2 meq/L (ref 3.5–5.1)
Sodium: 141 mEq/L (ref 136–145)
TOTAL PROTEIN: 6.3 g/dL — AB (ref 6.4–8.3)
Total Bilirubin: 1.78 mg/dL — ABNORMAL HIGH (ref 0.20–1.20)

## 2014-06-21 LAB — LACTATE DEHYDROGENASE (CC13): LDH: 162 U/L (ref 125–245)

## 2014-06-21 LAB — MORPHOLOGY: PLT EST: DECREASED

## 2014-06-21 NOTE — Telephone Encounter (Signed)
Pt confirmed labs/ov per 05/13 POF, gave pt AVS and Calendar.... KJ

## 2014-06-22 DIAGNOSIS — R17 Unspecified jaundice: Secondary | ICD-10-CM | POA: Insufficient documentation

## 2014-06-22 DIAGNOSIS — J069 Acute upper respiratory infection, unspecified: Secondary | ICD-10-CM | POA: Insufficient documentation

## 2014-06-22 NOTE — Assessment & Plan Note (Signed)
He has mild cold like symptoms I recommend conservative management only There is no need to prescribe antibiotics

## 2014-06-22 NOTE — Assessment & Plan Note (Signed)
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

## 2014-06-22 NOTE — Assessment & Plan Note (Signed)
Clinically he has no signs of disease recurrence We discussed new therapeutic advances for hairy cell leukemia I recommend annual visit with history, physical examination and blood work

## 2014-06-22 NOTE — Progress Notes (Signed)
[name] progress notes  Patient Care Team: Helane Rima, MD as PCP - General (Family Medicine)  CHIEF COMPLAINTS/PURPOSE OF VISIT:  Hairy cell leukemia  HISTORY OF PRESENTING ILLNESS:  Hector Lawson 54 y.o. male was transferred to my care after his prior physician has left.  I reviewed the patient's records extensive and collaborated the history with the patient. Summary of his history is as follows: He was originally diagnosed after routine blood work detected mild abnormalities. Bone marrow on 01/13/2011showed 42% lymphocytes and approximately 31% hairy cells. Spleen by ultrasound measured approximately 14 cm with a volume of 995 mL. The patient was treated with 2-chlorodeoxyadenosine for 5 days beginning on February 14th and concluding on March 28, 2009. Flow studies on the peripheral blood in July 2011 were negative. 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  He feels well He has mild cold symptoms with sore throat and nasal congestions  MEDICAL HISTORY:  Past Medical History  Diagnosis Date  . Colon polyps     SURGICAL HISTORY: Past Surgical History  Procedure Laterality Date  . Tonsillectomy and adenoidectomy      SOCIAL HISTORY: History   Social History  . Marital Status: Married    Spouse Name: N/A  . Number of Children: N/A  . Years of Education: N/A   Occupational History  . Not on file.   Social History Main Topics  . Smoking status: Never Smoker   . Smokeless tobacco: Never Used  . Alcohol Use: 4.2 oz/week    7 Glasses of wine per week  . Drug Use: No  . Sexual Activity: Not on file   Other Topics Concern  . Not on file   Social History Narrative    FAMILY HISTORY: Family History  Problem Relation Age of Onset  . Cancer Father     mesothelioma  . Cancer Paternal Grandmother     breast ca    ALLERGIES:  has No Known Allergies.  MEDICATIONS:  Current Outpatient Prescriptions   Medication Sig Dispense Refill  . famotidine (PEPCID) 10 MG tablet Take 10 mg by mouth 2 (two) times daily as needed for heartburn.     No current facility-administered medications for this visit.    REVIEW OF SYSTEMS:   Constitutional: Denies fevers, chills or abnormal night sweats Eyes: Denies blurriness of vision, double vision or watery eyes Respiratory: Denies cough, dyspnea or wheezes Cardiovascular: Denies palpitation, chest discomfort or lower extremity swelling Gastrointestinal:  Denies nausea, heartburn or change in bowel habits Skin: Denies abnormal skin rashes Lymphatics: Denies new lymphadenopathy or easy bruising Neurological:Denies numbness, tingling or new weaknesses Behavioral/Psych: Mood is stable, no new changes  All other systems were reviewed with the patient and are negative.  PHYSICAL EXAMINATION: ECOG PERFORMANCE STATUS: 0 - Asymptomatic  Filed Vitals:   06/21/14 1435  BP: 148/74  Pulse: 92  Temp: 98.2 F (36.8 C)  Resp: 16   Filed Weights   06/21/14 1435  Weight: 226 lb 9.6 oz (102.785 kg)    GENERAL:alert, no distress and comfortable SKIN: skin color, texture, turgor are normal, no rashes or significant lesions EYES: normal, conjunctiva are pink and non-injected, sclera clear OROPHARYNX:no exudate, normal lips, buccal mucosa, and tongue  NECK: supple, thyroid normal size, non-tender, without nodularity LYMPH:  no palpable lymphadenopathy in the cervical, axillary or inguinal LUNGS: clear to auscultation and percussion with normal breathing effort HEART: regular rate & rhythm and no murmurs without lower extremity edema ABDOMEN:abdomen soft,  non-tender and normal bowel sounds Musculoskeletal:no cyanosis of digits and no clubbing  PSYCH: alert & oriented x 3 with fluent speech NEURO: no focal motor/sensory deficits  LABORATORY DATA:  I have reviewed the data as listed Lab Results  Component Value Date   WBC 5.9 06/21/2014   HGB 17.0  06/21/2014   HCT 48.6 06/21/2014   MCV 86.0 06/21/2014   PLT 98* 06/21/2014    Recent Labs  01/02/14 0949 06/21/14 1422  NA 140 141  K 4.6 4.2  CO2 26 25  GLUCOSE 114 117  BUN 17.8 14.2  CREATININE 1.4* 1.2  CALCIUM 9.1 8.9  PROT 6.2* 6.3*  ALBUMIN 4.0 4.1  AST 15 15  ALT 22 24  ALKPHOS 59 65  BILITOT 1.22* 1.78*    ASSESSMENT & PLAN:  Hairy cell leukemia, in remission Clinically he has no signs of disease recurrence We discussed new therapeutic advances for hairy cell leukemia I recommend annual visit with history, physical examination and blood work   Thrombocytopenia Cause is unknown, likely related to recent infection I recommend recheck with PCP next month He is not symtomatic   Total bilirubin, elevated Cause is unknown 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 CBC with Differential/Platelet    Standing Status: Future     Number of Occurrences:      Standing Expiration Date: 07/26/2015  . Comprehensive metabolic panel    Standing Status: Future     Number of Occurrences:      Standing Expiration Date: 07/26/2015  . Lactate dehydrogenase    Standing Status: Future     Number of Occurrences:      Standing Expiration Date: 07/26/2015  . Morphology    Standing Status: Future     Number of Occurrences:      Standing Expiration Date: 07/26/2015    All questions were answered. The patient knows to call the clinic with any problems, questions or concerns. I spent 25 minutes counseling the patient face to face. The total time spent in the appointment was 30 minutes and more than 50% was on counseling.     [facility], [place], MD 06/22/2014 9:51 PM

## 2014-06-22 NOTE — Assessment & Plan Note (Signed)
Cause is unknown, likely related to recent infection I recommend recheck with PCP next month He is not symtomatic

## 2015-03-14 ENCOUNTER — Emergency Department (HOSPITAL_COMMUNITY): Payer: BLUE CROSS/BLUE SHIELD

## 2015-03-14 ENCOUNTER — Emergency Department (HOSPITAL_COMMUNITY)
Admission: EM | Admit: 2015-03-14 | Discharge: 2015-03-14 | Disposition: A | Discharge status: Home / Self Care | Payer: BLUE CROSS/BLUE SHIELD | Attending: Emergency Medicine | Admitting: Emergency Medicine

## 2015-03-14 ENCOUNTER — Encounter (HOSPITAL_COMMUNITY): Payer: Self-pay

## 2015-03-14 DIAGNOSIS — Z856 Personal history of leukemia: Secondary | ICD-10-CM | POA: Diagnosis not present

## 2015-03-14 DIAGNOSIS — R079 Chest pain, unspecified: Secondary | ICD-10-CM | POA: Diagnosis present

## 2015-03-14 DIAGNOSIS — J069 Acute upper respiratory infection, unspecified: Secondary | ICD-10-CM | POA: Diagnosis not present

## 2015-03-14 DIAGNOSIS — R0789 Other chest pain: Secondary | ICD-10-CM

## 2015-03-14 DIAGNOSIS — Z79899 Other long term (current) drug therapy: Secondary | ICD-10-CM | POA: Insufficient documentation

## 2015-03-14 DIAGNOSIS — F419 Anxiety disorder, unspecified: Secondary | ICD-10-CM | POA: Diagnosis not present

## 2015-03-14 DIAGNOSIS — Z86010 Personal history of colonic polyps: Secondary | ICD-10-CM | POA: Diagnosis not present

## 2015-03-14 HISTORY — DX: Hairy cell leukemia not having achieved remission: C91.40

## 2015-03-14 LAB — CBC
HEMATOCRIT: 48.4 % (ref 39.0–52.0)
Hemoglobin: 17 g/dL (ref 13.0–17.0)
MCH: 29.9 pg (ref 26.0–34.0)
MCHC: 35.1 g/dL (ref 30.0–36.0)
MCV: 85.1 fL (ref 78.0–100.0)
Platelets: 157 10*3/uL (ref 150–400)
RBC: 5.69 MIL/uL (ref 4.22–5.81)
RDW: 12.5 % (ref 11.5–15.5)
WBC: 6.1 10*3/uL (ref 4.0–10.5)

## 2015-03-14 LAB — BASIC METABOLIC PANEL
Anion gap: 10 (ref 5–15)
BUN: 14 mg/dL (ref 6–20)
CO2: 26 mmol/L (ref 22–32)
CREATININE: 1.32 mg/dL — AB (ref 0.61–1.24)
Calcium: 9.5 mg/dL (ref 8.9–10.3)
Chloride: 108 mmol/L (ref 101–111)
GFR calc Af Amer: >60 mL/min (ref >60)
GLUCOSE: 110 mg/dL — AB (ref 65–99)
POTASSIUM: 4.2 mmol/L (ref 3.5–5.1)
Sodium: 144 mmol/L (ref 135–145)

## 2015-03-14 LAB — I-STAT TROPONIN, ED
Troponin i, poc: 0.01 ng/mL (ref 0.00–0.08)
Troponin i, poc: 0.01 ng/mL (ref 0.00–0.08)

## 2015-03-14 MED ORDER — LORAZEPAM 1 MG PO TABS
1.0000 mg | ORAL_TABLET | Freq: Once | ORAL | Status: AC
Start: 2015-03-14 — End: 2015-03-14
  Administered 2015-03-14: 1 mg via ORAL
  Filled 2015-03-14: qty 1

## 2015-03-14 MED ORDER — LORAZEPAM 0.5 MG PO TABS: 0.5000 mg | ORAL_TABLET | Freq: Three times a day (TID) | ORAL | Status: DC | PRN

## 2015-03-14 MED ORDER — ALBUTEROL SULFATE HFA 108 (90 BASE) MCG/ACT IN AERS: 2.0000 | INHALATION_SPRAY | RESPIRATORY_TRACT | Status: DC | PRN

## 2015-03-14 MED ORDER — HYDROXYZINE HCL 25 MG PO TABS: 25.0000 mg | ORAL_TABLET | Freq: Four times a day (QID) | ORAL | Status: DC

## 2015-03-14 MED ORDER — IPRATROPIUM-ALBUTEROL 0.5-2.5 (3) MG/3ML IN SOLN
3.0000 mL | Freq: Once | RESPIRATORY_TRACT | Status: AC
  Administered 2015-03-14: 3 mL via RESPIRATORY_TRACT
  Filled 2015-03-14: qty 3

## 2015-03-14 MED ORDER — PREDNISONE 20 MG PO TABS: 40.0000 mg | ORAL_TABLET | Freq: Every day | ORAL | Status: DC

## 2015-03-14 MED ORDER — LORAZEPAM 2 MG/ML IJ SOLN: 1.0000 mg | Freq: Once | INTRAMUSCULAR | Status: DC

## 2015-03-14 MED ORDER — ASPIRIN 81 MG PO CHEW
324.0000 mg | CHEWABLE_TABLET | Freq: Once | ORAL | Status: AC
  Administered 2015-03-14: 324 mg via ORAL
  Filled 2015-03-14: qty 4

## 2015-03-14 NOTE — Discharge Instructions (Signed)
Generalized Anxiety Disorder Generalized anxiety disorder (GAD) is a mental disorder. It interferes with life functions, including relationships, work, and school. GAD is different from normal anxiety, which everyone experiences at some point in their lives in response to specific life events and activities. Normal anxiety actually helps [place] prepare for and get through these life events and activities. Normal anxiety goes away after the event or activity is over.  GAD causes anxiety that is not necessarily related to specific events or activities. It also causes excess anxiety in proportion to specific events or activities. The anxiety associated with GAD is also difficult to control. GAD can vary from mild to severe. People with severe GAD can have intense waves of anxiety with physical symptoms (panic attacks).  SYMPTOMS The anxiety and worry associated with GAD are difficult to control. This anxiety and worry are related to many life events and activities and also occur more days than not for 6 months or longer. People with GAD also have three or more of the following symptoms (one or more in children):  Restlessness.   Fatigue.  Difficulty concentrating.   Irritability.  Muscle tension.  Difficulty sleeping or unsatisfying sleep. DIAGNOSIS GAD is diagnosed through an assessment by your health care provider. Your health care provider will ask you questions aboutyour mood,physical symptoms, and events in your life. Your health care provider may ask you about your medical history and use of alcohol or drugs, including prescription medicines. Your health care provider may also do a physical exam and blood tests. Certain medical conditions and the use of certain substances can cause symptoms similar to those associated with GAD. Your health care provider may refer you to a mental health specialist for further evaluation. TREATMENT The following therapies are usually used to treat GAD:    Medication. Antidepressant medication usually is prescribed for long-term daily control. Antianxiety medicines may be added in severe cases, especially when panic attacks occur.   Talk therapy (psychotherapy). Certain types of talk therapy can be helpful in treating GAD by providing support, education, and guidance. A form of talk therapy called cognitive behavioral therapy can teach you healthy ways to think about and react to daily life events and activities.  Stress managementtechniques. These include yoga, meditation, and exercise and can be very helpful when they are practiced regularly. A mental health specialist can help determine which treatment is best for you. Some people see improvement with one therapy. However, other people require a combination of therapies.   This information is not intended to replace advice given to you by your health care provider. Make sure you discuss any questions you have with your health care provider.   Document Released: 05/22/2012 Document Revised: 02/15/2014 Document Reviewed: 05/22/2012 Elsevier Interactive Patient Education 2016 [employer]   Panic Attacks Panic attacks are sudden, short-livedsurges of severe anxiety, fear, or discomfort. They may occur for no reason when you are relaxed, when you are anxious, or when you are sleeping. Panic attacks may occur for a number of reasons:   Healthy people occasionally have panic attacks in extreme, life-threatening situations, such as war or natural disasters. Normal anxiety is a protective mechanism of the body that helps [place] react to danger (fight or flight response).  Panic attacks are often seen with anxiety disorders, such as panic disorder, social anxiety disorder, generalized anxiety disorder, and phobias. Anxiety disorders cause excessive or uncontrollable anxiety. They may interfere with your relationships or other life activities.  Panic attacks are sometimes seen with  other mental  illnesses, such as depression and posttraumatic stress disorder.  Certain medical conditions, prescription medicines, and drugs of abuse can cause panic attacks. SYMPTOMS  Panic attacks start suddenly, peak within 20 minutes, and are accompanied by four or more of the following symptoms:  Pounding heart or fast heart rate (palpitations).  Sweating.  Trembling or shaking.  Shortness of breath or feeling smothered.  Feeling choked.  Chest pain or discomfort.  Nausea or strange feeling in your stomach.  Dizziness, light-headedness, or feeling like you will faint.  Chills or hot flushes.  Numbness or tingling in your lips or hands and feet.  Feeling that things are not real or feeling that you are not yourself.  Fear of losing control or going crazy.  Fear of dying. Some of these symptoms can mimic serious medical conditions. For example, you may think you are having a heart attack. Although panic attacks can be very scary, they are not life threatening. DIAGNOSIS  Panic attacks are diagnosed through an assessment by your health care provider. Your health care provider will ask questions about your symptoms, such as where and when they occurred. Your health care provider will also ask about your medical history and use of alcohol and drugs, including prescription medicines. Your health care provider may order blood tests or other studies to rule out a serious medical condition. Your health care provider may refer you to a mental health professional for further evaluation. TREATMENT   Most healthy people who have one or two panic attacks in an extreme, life-threatening situation will not require treatment.  The treatment for panic attacks associated with anxiety disorders or other mental illness typically involves counseling with a mental health professional, medicine, or a combination of both. Your health care provider will help determine what treatment is best for you.  Panic  attacks due to physical illness usually go away with treatment of the illness. If prescription medicine is causing panic attacks, talk with your health care provider about stopping the medicine, decreasing the dose, or substituting another medicine.  Panic attacks due to alcohol or drug abuse go away with abstinence. Some adults need professional help in order to stop drinking or using drugs. HOME CARE INSTRUCTIONS   Take all medicines as directed by your health care provider.   Schedule and attend follow-up visits as directed by your health care provider. It is important to keep all your appointments. SEEK MEDICAL CARE IF:  You are not able to take your medicines as prescribed.  Your symptoms do not improve or get worse. SEEK IMMEDIATE MEDICAL CARE IF:   You experience panic attack symptoms that are different than your usual symptoms.  You have serious thoughts about hurting yourself or others.  You are taking medicine for panic attacks and have a serious side effect. MAKE SURE YOU:  Understand these instructions.  Will watch your condition.  Will get help right away if you are not doing well or get worse.   This information is not intended to replace advice given to you by your health care provider. Make sure you discuss any questions you have with your health care provider.   Document Released: 01/25/2005 Document Revised: 01/30/2013 Document Reviewed: 09/08/2012 Elsevier Interactive Patient Education 2016 [employer]   Nonspecific Chest Pain  Chest pain can be caused by many different conditions. There is always a chance that your pain could be related to something serious, such as a heart attack or a blood clot in your lungs.  Chest pain can also be caused by conditions that are not life-threatening. If you have chest pain, it is very important to follow up with your health care provider. CAUSES  Chest pain can be caused by:  Heartburn.  Pneumonia or  bronchitis.  Anxiety or stress.  Inflammation around your heart (pericarditis) or lung (pleuritis or pleurisy).  A blood clot in your lung.  A collapsed lung (pneumothorax). It can develop suddenly on its own (spontaneous pneumothorax) or from trauma to the chest.  Shingles infection (varicella-zoster virus).  Heart attack.  Damage to the bones, muscles, and cartilage that make up your chest wall. This can include:  Bruised bones due to injury.  Strained muscles or cartilage due to frequent or repeated coughing or overwork.  Fracture to one or more ribs.  Sore cartilage due to inflammation (costochondritis). RISK FACTORS  Risk factors for chest pain may include:  Activities that increase your risk for trauma or injury to your chest.  Respiratory infections or conditions that cause frequent coughing.  Medical conditions or overeating that can cause heartburn.  Heart disease or family history of heart disease.  Conditions or health behaviors that increase your risk of developing a blood clot.  Having had chicken pox (varicella zoster). SIGNS AND SYMPTOMS Chest pain can feel like:  Burning or tingling on the surface of your chest or deep in your chest.  Crushing, pressure, aching, or squeezing pain.  Dull or sharp pain that is worse when you move, cough, or take a deep breath.  Pain that is also felt in your back, neck, shoulder, or arm, or pain that spreads to any of these areas. Your chest pain may come and go, or it may stay constant. DIAGNOSIS Lab tests or other studies may be needed to find the cause of your pain. Your health care provider may have you take a test called an ambulatory ECG (electrocardiogram). An ECG records your heartbeat patterns at the time the test is performed. You may also have other tests, such as:  Transthoracic echocardiogram (TTE). During echocardiography, sound waves are used to create a picture of all of the heart structures and to look  at how blood flows through your heart.  Transesophageal echocardiogram (TEE).This is a more advanced imaging test that obtains images from inside your body. It allows your health care provider to see your heart in finer detail.  Cardiac monitoring. This allows your health care provider to monitor your heart rate and rhythm in real time.  Holter monitor. This is a portable device that records your heartbeat and can help to diagnose abnormal heartbeats. It allows your health care provider to track your heart activity for several days, if needed.  Stress tests. These can be done through exercise or by taking medicine that makes your heart beat more quickly.  Blood tests.  Imaging tests. TREATMENT  Your treatment depends on what is causing your chest pain. Treatment may include:  Medicines. These may include:  Acid blockers for heartburn.  Anti-inflammatory medicine.  Pain medicine for inflammatory conditions.  Antibiotic medicine, if an infection is present.  Medicines to dissolve blood clots.  Medicines to treat coronary artery disease.  Supportive care for conditions that do not require medicines. This may include:  Resting.  Applying heat or cold packs to injured areas.  Limiting activities until pain decreases. HOME CARE INSTRUCTIONS  If you were prescribed an antibiotic medicine, finish it all even if you start to feel better.  Avoid any activities that  bring on chest pain.  Do not use any tobacco products, including cigarettes, chewing tobacco, or electronic cigarettes. If you need help quitting, ask your health care provider.  Do not drink alcohol.  Take medicines only as directed by your health care provider.  Keep all follow-up visits as directed by your health care provider. This is important. This includes any further testing if your chest pain does not go away.  If heartburn is the cause for your chest pain, you may be told to keep your head raised  (elevated) while sleeping. This reduces the chance that acid will go from your stomach into your esophagus.  Make lifestyle changes as directed by your health care provider. These may include:  Getting regular exercise. Ask your health care provider to suggest some activities that are safe for you.  Eating a heart-healthy diet. A registered dietitian can help you to learn healthy eating options.  Maintaining a healthy weight.  Managing diabetes, if necessary.  Reducing stress. SEEK MEDICAL CARE IF:  Your chest pain does not go away after treatment.  You have a rash with blisters on your chest.  You have a fever. SEEK IMMEDIATE MEDICAL CARE IF:   Your chest pain is worse.  You have an increasing cough, or you cough up blood.  You have severe abdominal pain.  You have severe weakness.  You faint.  You have chills.  You have sudden, unexplained chest discomfort.  You have sudden, unexplained discomfort in your arms, back, neck, or jaw.  You have shortness of breath at any time.  You suddenly start to sweat, or your skin gets clammy.  You feel nauseous or you vomit.  You suddenly feel light-headed or dizzy.  Your heart begins to beat quickly, or it feels like it is skipping beats. These symptoms may represent a serious problem that is an emergency. Do not wait to see if the symptoms will go away. Get medical help right away. Call your local emergency services (911 in the [place] Do not drive yourself to the hospital.   This information is not intended to replace advice given to you by your health care provider. Make sure you discuss any questions you have with your health care provider.   Document Released: 11/04/2004 Document Revised: 02/15/2014 Document Reviewed: 08/31/2013 Elsevier Interactive Patient Education 2016 [employer] Inc.  Upper Respiratory Infection, Adult Most upper respiratory infections (URIs) are a viral infection of the air passages leading to the  lungs. A URI affects the nose, throat, and upper air passages. The most common type of URI is nasopharyngitis and is typically referred to as "the common cold." URIs run their course and usually go away on their own. Most of the time, a URI does not require medical attention, but sometimes a bacterial infection in the upper airways can follow a viral infection. This is called a secondary infection. Sinus and middle ear infections are common types of secondary upper respiratory infections. Bacterial pneumonia can also complicate a URI. A URI can worsen asthma and chronic obstructive pulmonary disease (COPD). Sometimes, these complications can require emergency medical care and may be life threatening.  CAUSES Almost all URIs are caused by viruses. A virus is a type of germ and can spread from one person to another.  RISKS FACTORS You may be at risk for a URI if:   You smoke.   You have chronic heart or lung disease.  You have a weakened defense (immune) system.   You are very young  or very old.   You have nasal allergies or asthma.  You work in crowded or poorly ventilated areas.  You work in health care facilities or schools. SIGNS AND SYMPTOMS  Symptoms typically develop 2-3 days after you come in contact with a cold virus. Most viral URIs last 7-10 days. However, viral URIs from the influenza virus (flu virus) can last 14-18 days and are typically more severe. Symptoms may include:   Runny or stuffy (congested) nose.   Sneezing.   Cough.   Sore throat.   Headache.   Fatigue.   Fever.   Loss of appetite.   Pain in your forehead, behind your eyes, and over your cheekbones (sinus pain).  Muscle aches.  DIAGNOSIS  Your health care provider may diagnose a URI by:  Physical exam.  Tests to check that your symptoms are not due to another condition such as:  Strep throat.  Sinusitis.  Pneumonia.  Asthma. TREATMENT  A URI goes away on its own with time.  It cannot be cured with medicines, but medicines may be prescribed or recommended to relieve symptoms. Medicines may help:  Reduce your fever.  Reduce your cough.  Relieve nasal congestion. HOME CARE INSTRUCTIONS   Take medicines only as directed by your health care provider.   Gargle warm saltwater or take cough drops to comfort your throat as directed by your health care provider.  Use a warm mist humidifier or inhale steam from a shower to increase air moisture. This may make it easier to breathe.  Drink enough fluid to keep your urine clear or pale yellow.   Eat soups and other clear broths and maintain good nutrition.   Rest as needed.   Return to work when your temperature has returned to normal or as your health care provider advises. You may need to stay home longer to avoid infecting others. You can also use a face mask and careful hand washing to prevent spread of the virus.  Increase the usage of your inhaler if you have asthma.   Do not use any tobacco products, including cigarettes, chewing tobacco, or electronic cigarettes. If you need help quitting, ask your health care provider. PREVENTION  The best way to protect yourself from getting a cold is to practice good hygiene.   Avoid oral or hand contact with people with cold symptoms.   Wash your hands often if contact occurs.  There is no clear evidence that vitamin C, vitamin E, echinacea, or exercise reduces the chance of developing a cold. However, it is always recommended to get plenty of rest, exercise, and practice good nutrition.  SEEK MEDICAL CARE IF:   You are getting worse rather than better.   Your symptoms are not controlled by medicine.   You have chills.  You have worsening shortness of breath.  You have brown or red mucus.  You have yellow or brown nasal discharge.  You have pain in your face, especially when you bend forward.  You have a fever.  You have swollen neck  glands.  You have pain while swallowing.  You have white areas in the back of your throat. SEEK IMMEDIATE MEDICAL CARE IF:   You have severe or persistent:  Headache.  Ear pain.  Sinus pain.  Chest pain.  You have chronic lung disease and any of the following:  Wheezing.  Prolonged cough.  Coughing up blood.  A change in your usual mucus.  You have a stiff neck.  You have changes  in your:  Vision.  Hearing.  Thinking.  Mood. MAKE SURE YOU:   Understand these instructions.  Will watch your condition.  Will get help right away if you are not doing well or get worse.   This information is not intended to replace advice given to you by your health care provider. Make sure you discuss any questions you have with your health care provider.   Document Released: 07/21/2000 Document Revised: 06/11/2014 Document Reviewed: 05/02/2013 Elsevier Interactive Patient Education 2016 [employer]  Acute Bronchitis Bronchitis is inflammation of the airways that extend from the windpipe into the lungs (bronchi). The inflammation often causes mucus to develop. This leads to a cough, which is the most common symptom of bronchitis.  In acute bronchitis, the condition usually develops suddenly and goes away over time, usually in a couple weeks. Smoking, allergies, and asthma can make bronchitis worse. Repeated episodes of bronchitis may cause further lung problems.  CAUSES Acute bronchitis is most often caused by the same virus that causes a cold. The virus can spread from person to person (contagious) through coughing, sneezing, and touching contaminated objects. SIGNS AND SYMPTOMS   Cough.   Fever.   Coughing up mucus.   Body aches.   Chest congestion.   Chills.   Shortness of breath.   Sore throat.  DIAGNOSIS  Acute bronchitis is usually diagnosed through a physical exam. Your health care provider will also ask you questions about your medical history.  Tests, such as chest X-rays, are sometimes done to rule out other conditions.  TREATMENT  Acute bronchitis usually goes away in a couple weeks. Oftentimes, no medical treatment is necessary. Medicines are sometimes given for relief of fever or cough. Antibiotic medicines are usually not needed but may be prescribed in certain situations. In some cases, an inhaler may be recommended to help reduce shortness of breath and control the cough. A cool mist vaporizer may also be used to help thin bronchial secretions and make it easier to clear the chest.  HOME CARE INSTRUCTIONS  Get plenty of rest.   Drink enough fluids to keep your urine clear or pale yellow (unless you have a medical condition that requires fluid restriction). Increasing fluids may help thin your respiratory secretions (sputum) and reduce chest congestion, and it will prevent dehydration.   Take medicines only as directed by your health care provider.  If you were prescribed an antibiotic medicine, finish it all even if you start to feel better.  Avoid smoking and secondhand smoke. Exposure to cigarette smoke or irritating chemicals will make bronchitis worse. If you are a smoker, consider using nicotine gum or skin patches to help control withdrawal symptoms. Quitting smoking will help your lungs heal faster.   Reduce the chances of another bout of acute bronchitis by washing your hands frequently, avoiding people with cold symptoms, and trying not to touch your hands to your mouth, nose, or eyes.   Keep all follow-up visits as directed by your health care provider.  SEEK MEDICAL CARE IF: Your symptoms do not improve after 1 week of treatment.  SEEK IMMEDIATE MEDICAL CARE IF:  You develop an increased fever or chills.   You have chest pain.   You have severe shortness of breath.  You have bloody sputum.   You develop dehydration.  You faint or repeatedly feel like you are going to pass out.  You develop  repeated vomiting.  You develop a severe headache. MAKE SURE YOU:   Understand these  instructions.  Will watch your condition.  Will get help right away if you are not doing well or get worse.   This information is not intended to replace advice given to you by your health care provider. Make sure you discuss any questions you have with your health care provider.   Document Released: 03/04/2004 Document Revised: 02/15/2014 Document Reviewed: 07/18/2012 Elsevier Interactive Patient Education [employer].

## 2015-03-14 NOTE — ED Provider Notes (Signed)
CSN: [id number]     Arrival date & time 03/14/15  1329 History   First MD Initiated Contact with Patient 03/14/15 1347     Chief Complaint  Patient presents with  . Anxiety  . Chest Pain     (Consider location/radiation/quality/duration/timing/severity/associated sxs/prior Treatment) Patient is a 55 y.o. male presenting with anxiety and chest pain. The history is provided by the patient.  Anxiety This is a new problem. The current episode started 1 to 4 weeks ago. The problem occurs constantly. The problem has been gradually worsening. Associated symptoms include chest pain and coughing. Pertinent negatives include no abdominal pain, fatigue, fever, headaches, nausea or vomiting.  Chest Pain Pain location:  L chest Pain quality: tightness   Pain radiates to the back: no   Pain severity:  Mild (3/10) Onset quality:  Unable to specify Duration:  2 weeks Progression:  Unable to specify Chronicity:  New Associated symptoms: anxiety and cough   Associated symptoms: no abdominal pain, no fatigue, no fever, no headache, no nausea, no orthopnea, no palpitations and not vomiting   Risk factors: no smoking   Pt presents with CP for 2 weeks, worse when anxious and when coughing, not associated with N, sweats, palpitations, SOB, wheeze, orthopnea, PND, exertional dyspnea.  Pt does not have hx of HTN, DM.  No personal cardiac hx.  No family hx of MI or stroke.  Pt having worsening generalized anxiety with panic attacks, secondary to increased personal stress.  He endorses difficulty sleeping, increased nervous eating, excessive worry.  He denies SI, HI, AVH.  He was seeing a therapist this morning for assessment of new anxiety symptoms, when the therapist suggested he present to the ER for chest pain.  He endorses recent URI symptoms including runny/stuffy nose, mild coughing, no fever, sweats, chills, wheeze or SOB.  Past Medical History  Diagnosis Date  . Colon polyps   . 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 Tonsillectomy and adenoidectomy     Family History  Problem Relation Age of Onset  . Cancer Father     mesothelioma  . Cancer Paternal Grandmother     breast ca   Social History  Substance Use Topics  . Smoking status: Never Smoker   . Smokeless tobacco: Never Used  . Alcohol Use: 4.2 oz/week    7 Glasses of wine per week    Review of Systems  Constitutional: Negative for fever and fatigue.  Respiratory: Positive for cough.   Cardiovascular: Positive for chest pain. Negative for palpitations and orthopnea.  Gastrointestinal: Negative for nausea, vomiting and abdominal pain.  Neurological: Negative for headaches.  All other systems reviewed and are negative.     Allergies  Review of patient's allergies indicates no known allergies.  Home Medications   Prior to Admission medications   Medication Sig Start Date End Date Taking? Authorizing Provider  Armodafinil 250 MG tablet Take 250 mg by mouth daily. 02/18/15  Yes Historical Provider, MD  ibuprofen (ADVIL,MOTRIN) 200 MG tablet Take 400 mg by mouth every 6 (six) hours as needed for headache, mild pain or moderate pain.   Yes Historical Provider, MD  albuterol (PROVENTIL HFA;VENTOLIN HFA) 108 (90 Base) MCG/ACT inhaler Inhale 2 puffs into the lungs every 4 (four) hours as needed for wheezing or shortness of breath. 03/14/15   Delsa Grana, PA-C  hydrOXYzine (ATARAX/VISTARIL) 25 MG tablet Take 1 tablet (25 mg total) by mouth every 6 (six) hours.  03/14/15   Delsa Grana, PA-C  LORazepam (ATIVAN) 0.5 MG tablet Take 1 tablet (0.5 mg total) by mouth 3 (three) times daily as needed for anxiety. 03/14/15   Delsa Grana, PA-C  predniSONE (DELTASONE) 20 MG tablet Take 2 tablets (40 mg total) by mouth daily. Take 40 mg by mouth daily for 3 days, then 20mg  by mouth daily for 3 days, then 10mg  daily for 3 days 03/14/15   Delsa Grana, PA-C   BP 138/81 mmHg  Pulse 65  Temp(Src) 98.5 F  (36.9 C) (Oral)  Resp 14  SpO2 98% Physical Exam  Constitutional: He is oriented to person, place, and time. He appears well-developed and well-nourished. No distress.  HENT:  Head: Normocephalic and atraumatic.  Mouth/Throat: Oropharynx is clear and moist. No oropharyngeal exudate.  Nasal congestion, erythematous nasal mucosa, clear discharge  Eyes: Conjunctivae and EOM are normal. Pupils are equal, round, and reactive to light. Right eye exhibits no discharge. Left eye exhibits no discharge. No scleral icterus.  Neck: Normal range of motion. No JVD present. No tracheal deviation present. No thyromegaly present.  Cardiovascular: Normal rate, regular rhythm, normal heart sounds and intact distal pulses.  Exam reveals no gallop and no friction rub.   No murmur heard. Pulmonary/Chest: Effort normal and breath sounds normal. No respiratory distress. He has no wheezes. He has no rales. He exhibits no tenderness.  Abdominal: Soft. Bowel sounds are normal. He exhibits no distension and no mass. There is no tenderness. There is no rebound and no guarding.  Musculoskeletal: Normal range of motion. He exhibits no edema or tenderness.  Lymphadenopathy:    He has no cervical adenopathy.  Neurological: He is alert and oriented to person, place, and time. He has normal reflexes. No cranial nerve deficit. He exhibits normal muscle tone. Coordination normal.  Skin: Skin is warm and dry. No rash noted. He is not diaphoretic. No erythema. No pallor.  Psychiatric: His behavior is normal. Judgment and thought content normal.  Somewhat flat affect, poor eye contact  Nursing note and vitals reviewed.   ED Course  Procedures (including critical care time) Labs Review Labs Reviewed  BASIC METABOLIC PANEL - Abnormal; Notable for the following:    Glucose, Bld 110 (*)    Creatinine, Ser 1.32 (*)    All other components within normal limits  CBC  I-STAT TROPOININ, ED  Randolm Idol, ED    Imaging  Review Dg Chest 2 View  03/14/2015  CLINICAL DATA:  Pressure and midchest. EXAM: CHEST  2 VIEW COMPARISON:  03/14/2015 FINDINGS: The heart size and mediastinal contours are within normal limits. Both lungs are clear. The visualized skeletal structures are unremarkable. IMPRESSION: No active cardiopulmonary disease. Electronically Signed   By: Kerby Moors M.D.   On: 03/14/2015 14:02   I have personally reviewed and evaluated these images and lab results as part of my medical decision-making.   EKG Interpretation   Date/Time:  Friday March 14 2015 13:38:11 EST Ventricular Rate:  81 PR Interval:  155 QRS Duration: 109 QT Interval:  385 QTC Calculation: 447 R Axis:   93 Text Interpretation:  Sinus rhythm Consider right ventricular hypertrophy  ED PHYSICIAN INTERPRETATION AVAILABLE IN [facility] Confirmed by  TEST, Record ([id number]) on 03/15/2015 8:53:59 AM      MDM   55 year-old male with chest tightness x 2 weeks associated with generalized anxiety with anxiety attacks, which worsen sx, also recent URI sx with mild, non-productive cough.  Pt was seen today  by therapist for eval of anxiety.  Denies SI, HI, AVH.  No current meds for anxiety.  Low risk for ACS, heart score 1, and suspect CP/Chest tightness secondary to anxiety and/or URI/bronchitis Pt given ativan in ER, had improvement of chest tightness with duoneb.   Labs unremarkable, troponin negative, CXR negative for PNA, EKG normal sinus rhythm w/o TWI, ST elevation or depression.    Will treat pt for bronchitis, given steroids and albuterol.  Encouraged OTC meds as needed for URI sx.  Also given vistaril Rx for generalized anxiety, and few ativan for breakthrough anxiety attacks.  Encouraged pt to follow up with PCP and continue to work with therapy for managing anxiety, as he will likely need daily med such as paxil, zoloft, etc, in the future.    He was discharged in good condition, VSS Filed Vitals:   03/14/15 1548  03/14/15 1714  BP: 142/94 138/81  Pulse: 83 65  Temp:    Resp: 16 14     Final diagnoses:  Anxiety  Chest tightness  URI (upper respiratory infection)       Delsa Grana, PA-C 03/21/15 Gilbert Liu, MD 03/21/15 1907

## 2015-03-14 NOTE — ED Notes (Signed)
Pt c/o increasing "nervousness" and generalized chest pressure x 3 weeks.  Pain score 3/10.  Pt reports that he has been under increased stress at work.  Denis cardiac history.  + SOB +Dizziness

## 2015-03-14 NOTE — ED Notes (Signed)
Pt reports anxiety x 3 weeks and getting worse.  Had an appt to see his therapist today.  Started to have chest pressure while he was there.  Reports he called his PCP office to make an appt and to have a rx for anxiety but was instructed to come to the ED d/t chest pressure.  Pt is A&Ox 4.  Pt in NAD at this time.

## 2015-06-23 ENCOUNTER — Telehealth: Payer: Self-pay | Admitting: Hematology and Oncology

## 2015-06-23 NOTE — Telephone Encounter (Signed)
pt called to r/s appt...done....pt ok and aware of new d.t °

## 2015-06-27 ENCOUNTER — Ambulatory Visit: Payer: BLUE CROSS/BLUE SHIELD | Admitting: Hematology and Oncology

## 2015-06-27 ENCOUNTER — Other Ambulatory Visit: Payer: BLUE CROSS/BLUE SHIELD

## 2015-07-16 ENCOUNTER — Encounter: Payer: Self-pay | Admitting: Hematology and Oncology

## 2015-07-17 ENCOUNTER — Telehealth: Payer: Self-pay | Admitting: *Deleted

## 2015-07-18 ENCOUNTER — Encounter: Payer: Self-pay | Admitting: *Deleted

## 2015-07-18 ENCOUNTER — Telehealth: Payer: Self-pay | Admitting: Hematology and Oncology

## 2015-07-18 ENCOUNTER — Other Ambulatory Visit: Payer: BLUE CROSS/BLUE SHIELD

## 2015-07-18 ENCOUNTER — Ambulatory Visit: Payer: BLUE CROSS/BLUE SHIELD | Admitting: Hematology and Oncology

## 2015-07-18 NOTE — Telephone Encounter (Signed)
Attempted to contact pt regarding his appts this afternoon.  He sent email asking to r/s his appts to 6/23.  I emailed him back but have not heard back from him yet, so unsure whether he intends to keep today's appt or not.  I called but there is no answer and no voicemail,  So I sent another My Chart email.

## 2015-07-18 NOTE — Telephone Encounter (Signed)
lvm for pt regarding to 6.9 appt moved to 6.30...the patient ok and aware

## 2015-08-06 ENCOUNTER — Other Ambulatory Visit: Payer: Self-pay | Admitting: *Deleted

## 2015-08-06 DIAGNOSIS — C9141 Hairy cell leukemia, in remission: Secondary | ICD-10-CM

## 2015-08-08 ENCOUNTER — Encounter: Payer: Self-pay | Admitting: Hematology and Oncology

## 2015-08-08 ENCOUNTER — Telehealth: Payer: Self-pay | Admitting: Hematology and Oncology

## 2015-08-08 ENCOUNTER — Ambulatory Visit (HOSPITAL_BASED_OUTPATIENT_CLINIC_OR_DEPARTMENT_OTHER): Payer: BLUE CROSS/BLUE SHIELD | Admitting: Hematology and Oncology

## 2015-08-08 ENCOUNTER — Other Ambulatory Visit: Payer: Self-pay | Admitting: Hematology and Oncology

## 2015-08-08 ENCOUNTER — Other Ambulatory Visit (HOSPITAL_BASED_OUTPATIENT_CLINIC_OR_DEPARTMENT_OTHER): Payer: BLUE CROSS/BLUE SHIELD

## 2015-08-08 VITALS — BP 137/65 | HR 75 | Temp 98.6°F | Resp 18 | Ht 72.0 in | Wt 233.6 lb

## 2015-08-08 DIAGNOSIS — D696 Thrombocytopenia, unspecified: Secondary | ICD-10-CM | POA: Diagnosis not present

## 2015-08-08 DIAGNOSIS — C9141 Hairy cell leukemia, in remission: Secondary | ICD-10-CM

## 2015-08-08 LAB — CBC WITH DIFFERENTIAL/PLATELET
BASO%: 0.3 % (ref 0.0–2.0)
Basophils Absolute: 0 10*3/uL (ref 0.0–0.1)
EOS ABS: 0.1 10*3/uL (ref 0.0–0.5)
EOS%: 0.9 % (ref 0.0–7.0)
HCT: 48.1 % (ref 38.4–49.9)
HGB: 16.1 g/dL (ref 13.0–17.1)
LYMPH%: 13.4 % — AB (ref 14.0–49.0)
MCH: 29 pg (ref 27.2–33.4)
MCHC: 33.5 g/dL (ref 32.0–36.0)
MCV: 86.6 fL (ref 79.3–98.0)
MONO#: 0.3 10*3/uL (ref 0.1–0.9)
MONO%: 4.5 % (ref 0.0–14.0)
NEUT%: 80.9 % — AB (ref 39.0–75.0)
NEUTROS ABS: 4.8 10*3/uL (ref 1.5–6.5)
Platelets: 125 10*3/uL — ABNORMAL LOW (ref 140–400)
RBC: 5.56 10*6/uL (ref 4.20–5.82)
RDW: 12.5 % (ref 11.0–14.6)
WBC: 5.9 10*3/uL (ref 4.0–10.3)
lymph#: 0.8 10*3/uL — ABNORMAL LOW (ref 0.9–3.3)

## 2015-08-08 LAB — COMPREHENSIVE METABOLIC PANEL
ALBUMIN: 4 g/dL (ref 3.5–5.0)
ALK PHOS: 86 U/L (ref 40–150)
ALT: 33 U/L (ref 0–55)
ANION GAP: 9 meq/L (ref 3–11)
AST: 17 U/L (ref 5–34)
BILIRUBIN TOTAL: 1.05 mg/dL (ref 0.20–1.20)
BUN: 18.2 mg/dL (ref 7.0–26.0)
CALCIUM: 9.3 mg/dL (ref 8.4–10.4)
CO2: 25 mEq/L (ref 22–29)
Chloride: 107 mEq/L (ref 98–109)
Creatinine: 1.3 mg/dL (ref 0.7–1.3)
EGFR: 65 mL/min/{1.73_m2} — AB (ref >90)
GLUCOSE: 126 mg/dL (ref 70–140)
POTASSIUM: 4.2 meq/L (ref 3.5–5.1)
SODIUM: 141 meq/L (ref 136–145)
TOTAL PROTEIN: 6.5 g/dL (ref 6.4–8.3)

## 2015-08-08 LAB — MORPHOLOGY
PLT EST: DECREASED
RBC COMMENTS: NORMAL

## 2015-08-08 LAB — LACTATE DEHYDROGENASE: LDH: 174 U/L (ref 125–245)

## 2015-08-08 NOTE — Assessment & Plan Note (Signed)
Cause is unknown, likely related to prior treatment He is not symtomatic I recommend observation only.

## 2015-08-08 NOTE — Telephone Encounter (Signed)
Gave and printed appt sched and avs for pt for NOV 2018

## 2015-08-08 NOTE — Assessment & Plan Note (Signed)
Clinically he has no signs of disease recurrence We discussed new therapeutic advances for hairy cell leukemia I recommend annual visit with history, physical examination and blood work We discussed the importance of annual influenza vaccination. The patient will also qualify for pneumococcal vaccination. He declined vaccinations today

## 2015-08-08 NOTE — Progress Notes (Signed)
[facility] OFFICE PROGRESS NOTE  Patient Care Team: Helane Rima, MD as PCP - General (Family Medicine)  SUMMARY OF ONCOLOGIC HISTORY: Hector Lawson male was transferred to my care after his prior physician has left.  I reviewed the patient's records extensive and collaborated the history with the patient. Summary of his history is as follows: He was originally diagnosed after routine blood work detected mild abnormalities. Bone marrow on 01/13/2011showed 42% lymphocytes and approximately 31% hairy cells. Spleen by ultrasound measured approximately 14 cm with a volume of 995 mL. The patient was treated with 2-chlorodeoxyadenosine for 5 days beginning on February 14th and concluding on March 28, 2009. Flow studies on the peripheral blood in July 2011 were negative. 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  INTERVAL HISTORY: Please see below for problem oriented charting. He feels well He denies recent infection. He has gained a lot of weight since I saw him. He is motivated to get enrolled in the gym with exercise program and weight loss The patient denies any recent signs or symptoms of bleeding such as spontaneous epistaxis, hematuria or hematochezia.   REVIEW OF SYSTEMS:   Constitutional: Denies fevers, chills or abnormal weight loss Eyes: Denies blurriness of vision Ears, nose, mouth, throat, and face: Denies mucositis or sore throat Respiratory: Denies cough, dyspnea or wheezes Cardiovascular: Denies palpitation, chest discomfort or lower extremity swelling Gastrointestinal:  Denies nausea, heartburn or change in bowel habits Skin: Denies abnormal skin rashes Lymphatics: Denies new lymphadenopathy or easy bruising Neurological:Denies numbness, tingling or new weaknesses Behavioral/Psych: Mood is stable, no new changes  All other systems were reviewed with the patient and are negative.  I have reviewed the past medical history, past  surgical history, social history and family history with the patient and they are unchanged from previous note.  ALLERGIES:  has No Known Allergies.  MEDICATIONS:  Current Outpatient Prescriptions  Medication Sig Dispense Refill  . Armodafinil 250 MG tablet Take 250 mg by mouth daily.  4  . B Complex-C (SUPER B COMPLEX PO) Take by mouth daily.    . citalopram (CELEXA) 20 MG tablet Take 20 mg by mouth daily.    Marland Kitchen ibuprofen (ADVIL,MOTRIN) 200 MG tablet Take 400 mg by mouth every 6 (six) hours as needed for headache, mild pain or moderate pain.    Marland Kitchen LORazepam (ATIVAN) 0.5 MG tablet Take 1 tablet (0.5 mg total) by mouth 3 (three) times daily as needed for anxiety. 12 tablet 0  . Omega-3 Fatty Acids (FISH OIL PO) Take 1,200 mg by mouth daily.     No current facility-administered medications for this visit.    PHYSICAL EXAMINATION: ECOG PERFORMANCE STATUS: 0 - Asymptomatic  Filed Vitals:   08/08/15 1443  BP: 137/65  Pulse: 75  Temp: 98.6 F (37 C)  Resp: 18   Filed Weights   08/08/15 1443  Weight: 233 lb 9.6 oz (105.96 kg)    GENERAL:alert, no distress and comfortable. He is obese SKIN: skin color, texture, turgor are normal, no rashes or significant lesions EYES: normal, Conjunctiva are pink and non-injected, sclera clear OROPHARYNX:no exudate, no erythema and lips, buccal mucosa, and tongue normal  NECK: supple, thyroid normal size, non-tender, without nodularity LYMPH:  no palpable lymphadenopathy in the cervical, axillary or inguinal LUNGS: clear to auscultation and percussion with normal breathing effort HEART: regular rate & rhythm and no murmurs and no lower extremity edema ABDOMEN:abdomen soft, non-tender and normal bowel sounds Musculoskeletal:no cyanosis of digits  and no clubbing  NEURO: alert & oriented x 3 with fluent speech, no focal motor/sensory deficits  LABORATORY DATA:  I have reviewed the data as listed    Component Value Date/Time   NA 141 08/08/2015  1431   NA 144 03/14/2015 1349   K 4.2 08/08/2015 1431   K 4.2 03/14/2015 1349   CL 108 03/14/2015 1349   CL 106 03/30/2012 1000   CO2 25 08/08/2015 1431   CO2 26 03/14/2015 1349   GLUCOSE 126 08/08/2015 1431   GLUCOSE 110* 03/14/2015 1349   GLUCOSE 131* 03/30/2012 1000   BUN 18.2 08/08/2015 1431   BUN 14 03/14/2015 1349   CREATININE 1.3 08/08/2015 1431   CREATININE 1.32* 03/14/2015 1349   CALCIUM 9.3 08/08/2015 1431   CALCIUM 9.5 03/14/2015 1349   PROT 6.5 08/08/2015 1431   PROT 6.9 09/20/2011 1536   ALBUMIN 4.0 08/08/2015 1431   ALBUMIN 4.3 09/20/2011 1536   AST 17 08/08/2015 1431   AST 20 09/20/2011 1536   ALT 33 08/08/2015 1431   ALT 34 09/20/2011 1536   ALKPHOS 86 08/08/2015 1431   ALKPHOS 55 09/20/2011 1536   BILITOT 1.05 08/08/2015 1431   BILITOT 1.0 09/20/2011 1536   GFRNONAA >60 03/14/2015 1349   GFRAA >60 03/14/2015 1349    No results found for: SPEP, UPEP  Lab Results  Component Value Date   WBC 5.9 08/08/2015   NEUTROABS 4.8 08/08/2015   HGB 16.1 08/08/2015   HCT 48.1 08/08/2015   MCV 86.6 08/08/2015   PLT 125* 08/08/2015      Chemistry      Component Value Date/Time   NA 141 08/08/2015 1431   NA 144 03/14/2015 1349   K 4.2 08/08/2015 1431   K 4.2 03/14/2015 1349   CL 108 03/14/2015 1349   CL 106 03/30/2012 1000   CO2 25 08/08/2015 1431   CO2 26 03/14/2015 1349   BUN 18.2 08/08/2015 1431   BUN 14 03/14/2015 1349   CREATININE 1.3 08/08/2015 1431   CREATININE 1.32* 03/14/2015 1349      Component Value Date/Time   CALCIUM 9.3 08/08/2015 1431   CALCIUM 9.5 03/14/2015 1349   ALKPHOS 86 08/08/2015 1431   ALKPHOS 55 09/20/2011 1536   AST 17 08/08/2015 1431   AST 20 09/20/2011 1536   ALT 33 08/08/2015 1431   ALT 34 09/20/2011 1536   BILITOT 1.05 08/08/2015 1431   BILITOT 1.0 09/20/2011 1536       ASSESSMENT & PLAN:  Hairy cell leukemia, in remission Clinically he has no signs of disease recurrence We discussed new therapeutic advances  for hairy cell leukemia I recommend annual visit with history, physical examination and blood work We discussed the importance of annual influenza vaccination. The patient will also qualify for pneumococcal vaccination. He declined vaccinations today    Thrombocytopenia Cause is unknown, likely related to prior treatment He is not symtomatic I recommend observation only.   Orders Placed This Encounter  Procedures  . CBC with Differential/Platelet    Standing Status: Future     Number of Occurrences:      Standing Expiration Date: 09/11/2016  . Morphology    Standing Status: Future     Number of Occurrences:      Standing Expiration Date: 09/11/2016   All questions were answered. The patient knows to call the clinic with any problems, questions or concerns. No barriers to learning was detected. I spent 15 minutes counseling the patient face to face.  The total time spent in the appointment was 20 minutes and more than 50% was on counseling and review of test results     [facility], [place], MD 08/08/2015 3:51 PM

## 2015-12-25 ENCOUNTER — Ambulatory Visit: Payer: BLUE CROSS/BLUE SHIELD | Admitting: Hematology and Oncology

## 2015-12-25 ENCOUNTER — Other Ambulatory Visit: Payer: BLUE CROSS/BLUE SHIELD

## 2016-12-24 ENCOUNTER — Other Ambulatory Visit (HOSPITAL_BASED_OUTPATIENT_CLINIC_OR_DEPARTMENT_OTHER): Payer: BLUE CROSS/BLUE SHIELD

## 2016-12-24 ENCOUNTER — Ambulatory Visit: Payer: BLUE CROSS/BLUE SHIELD | Admitting: Hematology and Oncology

## 2016-12-24 ENCOUNTER — Telehealth: Payer: Self-pay | Admitting: Hematology and Oncology

## 2016-12-24 ENCOUNTER — Other Ambulatory Visit: Payer: Self-pay | Admitting: Hematology and Oncology

## 2016-12-24 ENCOUNTER — Encounter: Payer: Self-pay | Admitting: Hematology and Oncology

## 2016-12-24 DIAGNOSIS — D696 Thrombocytopenia, unspecified: Secondary | ICD-10-CM

## 2016-12-24 DIAGNOSIS — C9141 Hairy cell leukemia, in remission: Secondary | ICD-10-CM | POA: Diagnosis not present

## 2016-12-24 DIAGNOSIS — R030 Elevated blood-pressure reading, without diagnosis of hypertension: Secondary | ICD-10-CM | POA: Diagnosis not present

## 2016-12-24 LAB — CBC WITH DIFFERENTIAL/PLATELET
BASO%: 0.7 % (ref 0.0–2.0)
Basophils Absolute: 0.1 10*3/uL (ref 0.0–0.1)
EOS%: 2.5 % (ref 0.0–7.0)
Eosinophils Absolute: 0.2 10*3/uL (ref 0.0–0.5)
HEMATOCRIT: 48.9 % (ref 38.4–49.9)
HGB: 16.4 g/dL (ref 13.0–17.1)
LYMPH#: 0.9 10*3/uL (ref 0.9–3.3)
LYMPH%: 13.3 % — ABNORMAL LOW (ref 14.0–49.0)
MCH: 29.3 pg (ref 27.2–33.4)
MCHC: 33.6 g/dL (ref 32.0–36.0)
MCV: 87 fL (ref 79.3–98.0)
MONO#: 0.3 10*3/uL (ref 0.1–0.9)
MONO%: 4.6 % (ref 0.0–14.0)
NEUT%: 78.9 % — ABNORMAL HIGH (ref 39.0–75.0)
NEUTROS ABS: 5.5 10*3/uL (ref 1.5–6.5)
PLATELETS: 165 10*3/uL (ref 140–400)
RBC: 5.62 10*6/uL (ref 4.20–5.82)
RDW: 12.7 % (ref 11.0–14.6)
WBC: 6.9 10*3/uL (ref 4.0–10.3)

## 2016-12-24 NOTE — Progress Notes (Signed)
[facility] OFFICE PROGRESS NOTE  Patient Care Team: Helane Rima, MD as PCP - General (Family Medicine)  SUMMARY OF ONCOLOGIC HISTORY:  Hector Lawson male was transferred to my care after his prior physician has left.  I reviewed the patient's records extensive and collaborated the history with the patient. Summary of his history is as follows: He was originally diagnosed after routine blood work detected mild abnormalities. Bone marrow on 01/13/2011showed 42% lymphocytes and approximately 31% hairy cells. Spleen by ultrasound measured approximately 14 cm with a volume of 995 mL. The patient was treated with 2-chlorodeoxyadenosine for 5 days beginning on February 14th and concluding on March 28, 2009. Flow studies on the peripheral blood in July 2011 were negative. 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  INTERVAL HISTORY: Please see below for problem oriented charting. He returns for further follow-up He feels well He has cut down some alcohol intake recently He denies recent infection No new lymphadenopathy or splenomegaly or symptoms of left upper quadrant discomfort  REVIEW OF SYSTEMS:   Constitutional: Denies fevers, chills or abnormal weight loss Eyes: Denies blurriness of vision Ears, nose, mouth, throat, and face: Denies mucositis or sore throat Respiratory: Denies cough, dyspnea or wheezes Cardiovascular: Denies palpitation, chest discomfort or lower extremity swelling Gastrointestinal:  Denies nausea, heartburn or change in bowel habits Skin: Denies abnormal skin rashes Lymphatics: Denies new lymphadenopathy or easy bruising Neurological:Denies numbness, tingling or new weaknesses Behavioral/Psych: Mood is stable, no new changes  All other systems were reviewed with the patient and are negative.  I have reviewed the past medical history, past surgical history, social history and family history with the patient and they are  unchanged from previous note.  ALLERGIES:  has No Known Allergies.  MEDICATIONS:  Current Outpatient Medications  Medication Sig Dispense Refill  . Armodafinil 250 MG tablet Take 250 mg by mouth daily.  4  . citalopram (CELEXA) 20 MG tablet Take 20 mg by mouth daily.     No current facility-administered medications for this visit.     PHYSICAL EXAMINATION: ECOG PERFORMANCE STATUS: 1 - Symptomatic but completely ambulatory  Vitals:   12/24/16 1454  BP: (!) 147/86  Pulse: 66  Resp: 18  Temp: 98.7 F (37.1 C)  SpO2: 99%   Filed Weights   12/24/16 1454  Weight: 242 lb 3.2 oz (109.9 kg)    GENERAL:alert, no distress and comfortable SKIN: skin color, texture, turgor are normal, no rashes or significant lesions EYES: normal, Conjunctiva are pink and non-injected, sclera clear OROPHARYNX:no exudate, no erythema and lips, buccal mucosa, and tongue normal  NECK: supple, thyroid normal size, non-tender, without nodularity LYMPH:  no palpable lymphadenopathy in the cervical, axillary or inguinal LUNGS: clear to auscultation and percussion with normal breathing effort HEART: regular rate & rhythm and no murmurs and no lower extremity edema ABDOMEN:abdomen soft, non-tender and normal bowel sounds Musculoskeletal:no cyanosis of digits and no clubbing  NEURO: alert & oriented x 3 with fluent speech, no focal motor/sensory deficits  LABORATORY DATA:  I have reviewed the data as listed    Component Value Date/Time   NA 141 08/08/2015 1431   K 4.2 08/08/2015 1431   CL 108 03/14/2015 1349   CL 106 03/30/2012 1000   CO2 25 08/08/2015 1431   GLUCOSE 126 08/08/2015 1431   GLUCOSE 131 (H) 03/30/2012 1000   BUN 18.2 08/08/2015 1431   CREATININE 1.3 08/08/2015 1431   CALCIUM 9.3 08/08/2015 1431  PROT 6.5 08/08/2015 1431   ALBUMIN 4.0 08/08/2015 1431   AST 17 08/08/2015 1431   ALT 33 08/08/2015 1431   ALKPHOS 86 08/08/2015 1431   BILITOT 1.05 08/08/2015 1431   GFRNONAA >60  03/14/2015 1349   GFRAA >60 03/14/2015 1349    No results found for: SPEP, UPEP  Lab Results  Component Value Date   WBC 6.9 12/24/2016   NEUTROABS 5.5 12/24/2016   HGB 16.4 12/24/2016   HCT 48.9 12/24/2016   MCV 87.0 12/24/2016   PLT 165 12/24/2016      Chemistry      Component Value Date/Time   NA 141 08/08/2015 1431   K 4.2 08/08/2015 1431   CL 108 03/14/2015 1349   CL 106 03/30/2012 1000   CO2 25 08/08/2015 1431   BUN 18.2 08/08/2015 1431   CREATININE 1.3 08/08/2015 1431      Component Value Date/Time   CALCIUM 9.3 08/08/2015 1431   ALKPHOS 86 08/08/2015 1431   AST 17 08/08/2015 1431   ALT 33 08/08/2015 1431   BILITOT 1.05 08/08/2015 1431       ASSESSMENT & PLAN:  Hairy cell leukemia, in remission Clinically he has no signs of disease recurrence We discussed new therapeutic advances for hairy cell leukemia I recommend annual visit with history, physical examination and blood work We discussed the importance of annual influenza vaccination but he declined  Elevated BP without diagnosis of hypertension The patient has mildly elevated blood pressure without diagnosis of hypertension He has gained a lot of weight and has been somewhat sedentary He told me he was recently also diagnosed with elevated cholesterol level We discussed risk factor modifications I recommend consideration for aspirin therapy   No orders of the defined types were placed in this encounter.  All questions were answered. The patient knows to call the clinic with any problems, questions or concerns. No barriers to learning was detected. I spent 10 minutes counseling the patient face to face. The total time spent in the appointment was 15 minutes and more than 50% was on counseling and review of test results     [name], MD 12/24/2016 4:03 PM

## 2016-12-24 NOTE — Assessment & Plan Note (Signed)
Clinically he has no signs of disease recurrence We discussed new therapeutic advances for hairy cell leukemia I recommend annual visit with history, physical examination and blood work We discussed the importance of annual influenza vaccination but he declined

## 2016-12-24 NOTE — Assessment & Plan Note (Signed)
The patient has mildly elevated blood pressure without diagnosis of hypertension He has gained a lot of weight and has been somewhat sedentary He told me he was recently also diagnosed with elevated cholesterol level We discussed risk factor modifications I recommend consideration for aspirin therapy

## 2016-12-24 NOTE — Telephone Encounter (Signed)
Gave patient AVS and calendar of upcoming November 2019 appointments.

## 2017-12-18 ENCOUNTER — Encounter: Payer: Self-pay | Admitting: Hematology and Oncology

## 2017-12-23 ENCOUNTER — Ambulatory Visit: Payer: BLUE CROSS/BLUE SHIELD | Admitting: Hematology and Oncology

## 2017-12-23 ENCOUNTER — Other Ambulatory Visit: Payer: BLUE CROSS/BLUE SHIELD

## 2018-01-16 ENCOUNTER — Other Ambulatory Visit: Payer: BLUE CROSS/BLUE SHIELD

## 2018-01-16 ENCOUNTER — Ambulatory Visit: Payer: BLUE CROSS/BLUE SHIELD | Admitting: Hematology and Oncology

## 2018-01-25 ENCOUNTER — Encounter: Payer: Self-pay | Admitting: *Deleted

## 2018-01-26 ENCOUNTER — Other Ambulatory Visit: Payer: Self-pay | Admitting: Hematology and Oncology

## 2018-01-26 DIAGNOSIS — C9141 Hairy cell leukemia, in remission: Secondary | ICD-10-CM

## 2018-02-03 ENCOUNTER — Inpatient Hospital Stay: Payer: BLUE CROSS/BLUE SHIELD | Admitting: Hematology and Oncology

## 2018-02-03 ENCOUNTER — Telehealth: Payer: Self-pay | Admitting: Hematology and Oncology

## 2018-02-03 ENCOUNTER — Encounter: Payer: Self-pay | Admitting: Hematology and Oncology

## 2018-02-03 ENCOUNTER — Inpatient Hospital Stay: Payer: BLUE CROSS/BLUE SHIELD | Attending: Hematology and Oncology

## 2018-02-03 DIAGNOSIS — C9141 Hairy cell leukemia, in remission: Secondary | ICD-10-CM

## 2018-02-03 DIAGNOSIS — E669 Obesity, unspecified: Secondary | ICD-10-CM

## 2018-02-03 DIAGNOSIS — Z79899 Other long term (current) drug therapy: Secondary | ICD-10-CM | POA: Diagnosis not present

## 2018-02-03 DIAGNOSIS — E66811 Obesity, class 1: Secondary | ICD-10-CM | POA: Insufficient documentation

## 2018-02-03 LAB — CBC WITH DIFFERENTIAL/PLATELET
ABS IMMATURE GRANULOCYTES: 0.01 10*3/uL (ref 0.00–0.07)
BASOS PCT: 0 %
Basophils Absolute: 0 10*3/uL (ref 0.0–0.1)
EOS ABS: 0.1 10*3/uL (ref 0.0–0.5)
Eosinophils Relative: 2 %
HCT: 46.3 % (ref 39.0–52.0)
Hemoglobin: 15.9 g/dL (ref 13.0–17.0)
Immature Granulocytes: 0 %
Lymphocytes Relative: 19 %
Lymphs Abs: 0.9 10*3/uL (ref 0.7–4.0)
MCH: 29.4 pg (ref 26.0–34.0)
MCHC: 34.3 g/dL (ref 30.0–36.0)
MCV: 85.7 fL (ref 80.0–100.0)
MONO ABS: 0.3 10*3/uL (ref 0.1–1.0)
MONOS PCT: 6 %
Neutro Abs: 3.4 10*3/uL (ref 1.7–7.7)
Neutrophils Relative %: 73 %
PLATELETS: 142 10*3/uL — AB (ref 150–400)
RBC: 5.4 MIL/uL (ref 4.22–5.81)
RDW: 11.7 % (ref 11.5–15.5)
WBC: 4.8 10*3/uL (ref 4.0–10.5)
nRBC: 0 % (ref 0.0–0.2)

## 2018-02-03 NOTE — Progress Notes (Signed)
[name] OFFICE PROGRESS NOTE  Patient Care Team: Helane Rima, MD as PCP - General (Family Medicine)  ASSESSMENT & PLAN:  Hairy cell leukemia, in remission Clinically he has no signs of disease recurrence I recommend annual visit with history, physical examination and blood work We discussed the importance of annual influenza vaccination but he declined  Obesity (BMI 30.0-34.9) The patient is obese We discussed importance of dietary modification and weight loss strategy He appears motivated to lose approximately 12 pounds within the next year before I see him back.   No orders of the defined types were placed in this encounter.   INTERVAL HISTORY: Please see below for problem oriented charting. He returns with his wife for further follow-up He feels well Denies recent infection, fever or chills He has not been vaccinated with influenza vaccination yet  SUMMARY OF ONCOLOGIC HISTORY:  Hector Lawson male was transferred to my care after his prior physician has left.  I reviewed the patient's records extensive and collaborated the history with the patient. Summary of his history is as follows: He was originally diagnosed after routine blood work detected mild abnormalities. Bone marrow on 01/13/2011showed 42% lymphocytes and approximately 31% hairy cells. Spleen by ultrasound measured approximately 14 cm with a volume of 995 mL. The patient was treated with 2-chlorodeoxyadenosine for 5 days beginning on February 14th and concluding on March 28, 2009. Flow studies on the peripheral blood in July 2011 were negative. 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  REVIEW OF SYSTEMS:   Constitutional: Denies fevers, chills or abnormal weight loss Eyes: Denies blurriness of vision Ears, nose, mouth, throat, and face: Denies mucositis or sore throat Respiratory: Denies cough, dyspnea or wheezes Cardiovascular: Denies palpitation, chest  discomfort or lower extremity swelling Gastrointestinal:  Denies nausea, heartburn or change in bowel habits Skin: Denies abnormal skin rashes Lymphatics: Denies new lymphadenopathy or easy bruising Neurological:Denies numbness, tingling or new weaknesses Behavioral/Psych: Mood is stable, no new changes  All other systems were reviewed with the patient and are negative.  I have reviewed the past medical history, past surgical history, social history and family history with the patient and they are unchanged from previous note.  ALLERGIES:  has No Known Allergies.  MEDICATIONS:  Current Outpatient Medications  Medication Sig Dispense Refill  . atorvastatin (LIPITOR) 20 MG tablet Take 20 mg by mouth daily.    . Armodafinil 250 MG tablet Take 250 mg by mouth daily.  4  . citalopram (CELEXA) 20 MG tablet Take 20 mg by mouth daily.     No current facility-administered medications for this visit.     PHYSICAL EXAMINATION: ECOG PERFORMANCE STATUS: 0 - Asymptomatic  Vitals:   02/03/18 1220  BP: 136/73  Pulse: 68  Resp: 14  Temp: 98.1 F (36.7 C)  SpO2: 99%   Filed Weights   02/03/18 1220  Weight: 247 lb 12.8 oz (112.4 kg)    GENERAL:alert, no distress and comfortable SKIN: skin color, texture, turgor are normal, no rashes or significant lesions EYES: normal, Conjunctiva are pink and non-injected, sclera clear OROPHARYNX:no exudate, no erythema and lips, buccal mucosa, and tongue normal  NECK: supple, thyroid normal size, non-tender, without nodularity LYMPH:  no palpable lymphadenopathy in the cervical, axillary or inguinal LUNGS: clear to auscultation and percussion with normal breathing effort HEART: regular rate & rhythm and no murmurs and no lower extremity edema ABDOMEN:abdomen soft, non-tender and normal bowel sounds Musculoskeletal:no cyanosis of digits and no clubbing  NEURO: alert & oriented x 3 with fluent speech, no focal motor/sensory deficits  LABORATORY DATA:   I have reviewed the data as listed    Component Value Date/Time   NA 141 08/08/2015 1431   K 4.2 08/08/2015 1431   CL 108 03/14/2015 1349   CL 106 03/30/2012 1000   CO2 25 08/08/2015 1431   GLUCOSE 126 08/08/2015 1431   GLUCOSE 131 (H) 03/30/2012 1000   BUN 18.2 08/08/2015 1431   CREATININE 1.3 08/08/2015 1431   CALCIUM 9.3 08/08/2015 1431   PROT 6.5 08/08/2015 1431   ALBUMIN 4.0 08/08/2015 1431   AST 17 08/08/2015 1431   ALT 33 08/08/2015 1431   ALKPHOS 86 08/08/2015 1431   BILITOT 1.05 08/08/2015 1431   GFRNONAA >60 03/14/2015 1349   GFRAA >60 03/14/2015 1349    No results found for: SPEP, UPEP  Lab Results  Component Value Date   WBC 4.8 02/03/2018   NEUTROABS 3.4 02/03/2018   HGB 15.9 02/03/2018   HCT 46.3 02/03/2018   MCV 85.7 02/03/2018   PLT 142 (L) 02/03/2018      Chemistry      Component Value Date/Time   NA 141 08/08/2015 1431   K 4.2 08/08/2015 1431   CL 108 03/14/2015 1349   CL 106 03/30/2012 1000   CO2 25 08/08/2015 1431   BUN 18.2 08/08/2015 1431   CREATININE 1.3 08/08/2015 1431      Component Value Date/Time   CALCIUM 9.3 08/08/2015 1431   ALKPHOS 86 08/08/2015 1431   AST 17 08/08/2015 1431   ALT 33 08/08/2015 1431   BILITOT 1.05 08/08/2015 1431       All questions were answered. The patient knows to call the clinic with any problems, questions or concerns. No barriers to learning was detected.  I spent 10 minutes counseling the patient face to face. The total time spent in the appointment was 15 minutes and more than 50% was on counseling and review of test results  [name], MD 02/03/2018 1:38 PM

## 2018-02-03 NOTE — Telephone Encounter (Signed)
Scheduled appt per 12/27 los - pt aware of appts .

## 2018-02-03 NOTE — Assessment & Plan Note (Signed)
The patient is obese We discussed importance of dietary modification and weight loss strategy He appears motivated to lose approximately 12 pounds within the next year before I see him back.

## 2018-02-03 NOTE — Assessment & Plan Note (Signed)
Clinically he has no signs of disease recurrence I recommend annual visit with history, physical examination and blood work We discussed the importance of annual influenza vaccination but he declined

## 2019-02-05 ENCOUNTER — Telehealth: Payer: Self-pay | Admitting: Hematology and Oncology

## 2019-02-05 ENCOUNTER — Encounter: Payer: Self-pay | Admitting: Hematology and Oncology

## 2019-02-05 ENCOUNTER — Other Ambulatory Visit: Payer: Self-pay

## 2019-02-05 ENCOUNTER — Inpatient Hospital Stay: Payer: BC Managed Care – PPO

## 2019-02-05 ENCOUNTER — Inpatient Hospital Stay: Payer: BC Managed Care – PPO | Attending: Hematology and Oncology | Admitting: Hematology and Oncology

## 2019-02-05 VITALS — BP 117/73 | HR 72 | Temp 98.0°F | Resp 18 | Ht 72.0 in | Wt 237.3 lb

## 2019-02-05 DIAGNOSIS — C9141 Hairy cell leukemia, in remission: Secondary | ICD-10-CM | POA: Insufficient documentation

## 2019-02-05 DIAGNOSIS — Z7289 Other problems related to lifestyle: Secondary | ICD-10-CM | POA: Insufficient documentation

## 2019-02-05 DIAGNOSIS — Z79899 Other long term (current) drug therapy: Secondary | ICD-10-CM | POA: Insufficient documentation

## 2019-02-05 DIAGNOSIS — D696 Thrombocytopenia, unspecified: Secondary | ICD-10-CM | POA: Insufficient documentation

## 2019-02-05 DIAGNOSIS — E669 Obesity, unspecified: Secondary | ICD-10-CM

## 2019-02-05 DIAGNOSIS — R17 Unspecified jaundice: Secondary | ICD-10-CM | POA: Diagnosis not present

## 2019-02-05 LAB — CBC WITH DIFFERENTIAL/PLATELET
Abs Immature Granulocytes: 0.01 10*3/uL (ref 0.00–0.07)
Basophils Absolute: 0 10*3/uL (ref 0.0–0.1)
Basophils Relative: 1 %
Eosinophils Absolute: 0.1 10*3/uL (ref 0.0–0.5)
Eosinophils Relative: 2 %
HCT: 47.9 % (ref 39.0–52.0)
Hemoglobin: 16.5 g/dL (ref 13.0–17.0)
Immature Granulocytes: 0 %
Lymphocytes Relative: 20 %
Lymphs Abs: 1.2 10*3/uL (ref 0.7–4.0)
MCH: 29.2 pg (ref 26.0–34.0)
MCHC: 34.4 g/dL (ref 30.0–36.0)
MCV: 84.8 fL (ref 80.0–100.0)
Monocytes Absolute: 0.4 10*3/uL (ref 0.1–1.0)
Monocytes Relative: 6 %
Neutro Abs: 4.2 10*3/uL (ref 1.7–7.7)
Neutrophils Relative %: 71 %
Platelets: 130 10*3/uL — ABNORMAL LOW (ref 150–400)
RBC: 5.65 MIL/uL (ref 4.22–5.81)
RDW: 11.9 % (ref 11.5–15.5)
WBC: 5.9 10*3/uL (ref 4.0–10.5)
nRBC: 0 % (ref 0.0–0.2)

## 2019-02-05 LAB — COMPREHENSIVE METABOLIC PANEL
ALT: 70 U/L — ABNORMAL HIGH (ref 0–44)
AST: 27 U/L (ref 15–41)
Albumin: 4.2 g/dL (ref 3.5–5.0)
Alkaline Phosphatase: 98 U/L (ref 38–126)
Anion gap: 10 (ref 5–15)
BUN: 14 mg/dL (ref 6–20)
CO2: 27 mmol/L (ref 22–32)
Calcium: 8.8 mg/dL — ABNORMAL LOW (ref 8.9–10.3)
Chloride: 102 mmol/L (ref 98–111)
Creatinine, Ser: 1.26 mg/dL — ABNORMAL HIGH (ref 0.61–1.24)
GFR calc Af Amer: >60 mL/min (ref >60)
GFR calc non Af Amer: >60 mL/min (ref >60)
Glucose, Bld: 112 mg/dL — ABNORMAL HIGH (ref 70–99)
Potassium: 4.4 mmol/L (ref 3.5–5.1)
Sodium: 139 mmol/L (ref 135–145)
Total Bilirubin: 1.3 mg/dL — ABNORMAL HIGH (ref 0.3–1.2)
Total Protein: 6.5 g/dL (ref 6.5–8.1)

## 2019-02-05 NOTE — Assessment & Plan Note (Signed)
Clinically he has no signs of disease recurrence I recommend annual visit with history, physical examination and blood work We discussed the importance of annual influenza vaccination The patient is educated to watch out for signs and symptoms of cancer recurrence

## 2019-02-05 NOTE — Telephone Encounter (Signed)
Scheduled appt per 12/28 sch message - mailed reminder letter with appt date and time

## 2019-02-05 NOTE — Assessment & Plan Note (Signed)
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

## 2019-02-05 NOTE — Assessment & Plan Note (Signed)
Cause is unknown, likely related to prior treatment or related to recent alcohol use He is not symtomatic I recommend observation only.

## 2019-02-05 NOTE — Progress Notes (Signed)
[facility] OFFICE PROGRESS NOTE  Patient Care Team: Helane Rima, MD as PCP - General (Family Medicine)  ASSESSMENT & PLAN:  Hairy cell leukemia, in remission Clinically he has no signs of disease recurrence I recommend annual visit with history, physical examination and blood work We discussed the importance of annual influenza vaccination The patient is educated to watch out for signs and symptoms of cancer recurrence  Obesity (BMI 30.0-34.9) He has made tremendous progress and have lost 10 pounds since last time I saw him I continue to motivate him to pursue lifestyle changes and weight loss He is motivated to lose another 10 pounds before I see him back next year  Total bilirubin, elevated Cause is unknown 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   Orders Placed This Encounter  Procedures  . CBC with Differential    Standing Status:   Future    Standing Expiration Date:   03/11/2020  . Comprehensive metabolic panel    Standing Status:   Future    Standing Expiration Date:   03/11/2020    INTERVAL HISTORY: Please see below for problem oriented charting. He returns for annual visit related to diagnosis of hairy cell leukemia He denies recent infection He has lost 10 pounds since last time I saw him, due to lifestyle modification The patient denies any recent signs or symptoms of bleeding such as spontaneous epistaxis, hematuria or hematochezia.   SUMMARY OF ONCOLOGIC HISTORY:  ALAZAR PLONA male was transferred to my care after his prior physician has left.  I reviewed the patient's records extensive and collaborated the history with the patient. Summary of his history is as follows: He was originally diagnosed after routine blood work detected mild  abnormalities. Bone marrow on 01/13/2011showed 42% lymphocytes and approximately 31% hairy cells. Spleen by ultrasound measured approximately 14 cm with a volume of 995 mL. The patient was treated with 2-chlorodeoxyadenosine for 5 days beginning on February 14th and concluding on March 28, 2009. Flow studies on the peripheral blood in July 2011 were negative. 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  REVIEW OF SYSTEMS:   Constitutional: Denies fevers, chills or abnormal weight loss Eyes: Denies blurriness of vision Ears, nose, mouth, throat, and face: Denies mucositis or sore throat Respiratory: Denies cough, dyspnea or wheezes Cardiovascular: Denies palpitation, chest discomfort or lower extremity swelling Gastrointestinal:  Denies nausea, heartburn or change in bowel habits Skin: Denies abnormal skin rashes Lymphatics: Denies new lymphadenopathy or easy bruising Neurological:Denies numbness, tingling or new weaknesses Behavioral/Psych: Mood is stable, no new changes  All other systems were reviewed with the patient and are negative.  I have reviewed the past medical history, past surgical history, social history and family history with the patient and they are unchanged from previous note.  ALLERGIES:  has No Known Allergies.  MEDICATIONS:  Current Outpatient Medications  Medication Sig Dispense Refill  . atorvastatin (LIPITOR) 20 MG tablet Take 40 mg by mouth daily.    . Armodafinil 250 MG tablet Take 250 mg by mouth daily.  4  . citalopram (CELEXA) 20 MG tablet Take 20 mg by mouth daily.     No current facility-administered medications for this visit.    PHYSICAL EXAMINATION: ECOG PERFORMANCE STATUS: 0 - Asymptomatic  Vitals:   02/05/19 1122  BP:  117/73  Pulse: 72  Resp: 18  Temp: 98 F (36.7 C)  SpO2: 98%   Filed Weights   02/05/19 1122  Weight: 237 lb 4.8 oz (107.6 kg)    GENERAL:alert, no distress and comfortable NEURO: alert &  oriented x 3 with fluent speech, no focal motor/sensory deficits  LABORATORY DATA:  I have reviewed the data as listed    Component Value Date/Time   NA 139 02/05/2019 1037   NA 141 08/08/2015 1431   K 4.4 02/05/2019 1037   K 4.2 08/08/2015 1431   CL 102 02/05/2019 1037   CL 106 03/30/2012 1000   CO2 27 02/05/2019 1037   CO2 25 08/08/2015 1431   GLUCOSE 112 (H) 02/05/2019 1037   GLUCOSE 126 08/08/2015 1431   GLUCOSE 131 (H) 03/30/2012 1000   BUN 14 02/05/2019 1037   BUN 18.2 08/08/2015 1431   CREATININE 1.26 (H) 02/05/2019 1037   CREATININE 1.3 08/08/2015 1431   CALCIUM 8.8 (L) 02/05/2019 1037   CALCIUM 9.3 08/08/2015 1431   PROT 6.5 02/05/2019 1037   PROT 6.5 08/08/2015 1431   ALBUMIN 4.2 02/05/2019 1037   ALBUMIN 4.0 08/08/2015 1431   AST 27 02/05/2019 1037   AST 17 08/08/2015 1431   ALT 70 (H) 02/05/2019 1037   ALT 33 08/08/2015 1431   ALKPHOS 98 02/05/2019 1037   ALKPHOS 86 08/08/2015 1431   BILITOT 1.3 (H) 02/05/2019 1037   BILITOT 1.05 08/08/2015 1431   GFRNONAA >60 02/05/2019 1037   GFRAA >60 02/05/2019 1037    No results found for: SPEP, UPEP  Lab Results  Component Value Date   WBC 5.9 02/05/2019   NEUTROABS 4.2 02/05/2019   HGB 16.5 02/05/2019   HCT 47.9 02/05/2019   MCV 84.8 02/05/2019   PLT 130 (L) 02/05/2019      Chemistry      Component Value Date/Time   NA 139 02/05/2019 1037   NA 141 08/08/2015 1431   K 4.4 02/05/2019 1037   K 4.2 08/08/2015 1431   CL 102 02/05/2019 1037   CL 106 03/30/2012 1000   CO2 27 02/05/2019 1037   CO2 25 08/08/2015 1431   BUN 14 02/05/2019 1037   BUN 18.2 08/08/2015 1431   CREATININE 1.26 (H) 02/05/2019 1037   CREATININE 1.3 08/08/2015 1431      Component Value Date/Time   CALCIUM 8.8 (L) 02/05/2019 1037   CALCIUM 9.3 08/08/2015 1431   ALKPHOS 98 02/05/2019 1037   ALKPHOS 86 08/08/2015 1431   AST 27 02/05/2019 1037   AST 17 08/08/2015 1431   ALT 70 (H) 02/05/2019 1037   ALT 33 08/08/2015 1431    BILITOT 1.3 (H) 02/05/2019 1037   BILITOT 1.05 08/08/2015 1431      All questions were answered. The patient knows to call the clinic with any problems, questions or concerns. No barriers to learning was detected.  I spent 10 minutes counseling the patient face to face. The total time spent in the appointment was 15 minutes and more than 50% was on counseling and review of test results  [name], MD 02/05/2019 2:42 PM

## 2019-02-05 NOTE — Assessment & Plan Note (Signed)
He has made tremendous progress and have lost 10 pounds since last time I saw him I continue to motivate him to pursue lifestyle changes and weight loss He is motivated to lose another 10 pounds before I see him back next year

## 2020-01-15 ENCOUNTER — Encounter: Payer: Self-pay | Admitting: Hematology and Oncology

## 2020-01-29 ENCOUNTER — Other Ambulatory Visit: Payer: BC Managed Care – PPO

## 2020-01-29 ENCOUNTER — Ambulatory Visit: Payer: BC Managed Care – PPO | Admitting: Hematology and Oncology

## 2020-02-05 ENCOUNTER — Other Ambulatory Visit: Payer: BC Managed Care – PPO

## 2020-02-05 ENCOUNTER — Ambulatory Visit: Payer: BC Managed Care – PPO | Admitting: Hematology and Oncology
